# Patient Record
Sex: Female | Born: 1980
Health system: Southern US, Community
[De-identification: ages and names within clinical notes are randomized; demographics above are authoritative.]

## PROBLEM LIST (undated history)

## (undated) DIAGNOSIS — Z87442 Personal history of urinary calculi: Secondary | ICD-10-CM

## (undated) DIAGNOSIS — F32A Depression, unspecified: Secondary | ICD-10-CM

## (undated) DIAGNOSIS — H5310 Unspecified subjective visual disturbances: Secondary | ICD-10-CM

## (undated) DIAGNOSIS — T7840XA Allergy, unspecified, initial encounter: Secondary | ICD-10-CM

## (undated) DIAGNOSIS — F329 Major depressive disorder, single episode, unspecified: Secondary | ICD-10-CM

## (undated) HISTORY — DX: Personal history of urinary calculi: Z87.442

## (undated) HISTORY — DX: Depression, unspecified: F32.A

## (undated) HISTORY — DX: Major depressive disorder, single episode, unspecified: F32.9

## (undated) HISTORY — DX: Unspecified subjective visual disturbances: H53.10

## (undated) HISTORY — DX: Allergy, unspecified, initial encounter: T78.40XA

---

## 2006-07-22 DIAGNOSIS — Z87442 Personal history of urinary calculi: Secondary | ICD-10-CM

## 2006-07-22 HISTORY — DX: Personal history of urinary calculi: Z87.442

## 2007-03-04 ENCOUNTER — Emergency Department (HOSPITAL_COMMUNITY): Admission: EM | Admit: 2007-03-04 | Discharge: 2007-03-05 | Payer: Self-pay | Admitting: Emergency Medicine

## 2008-01-13 IMAGING — CT CT ABDOMEN W/O CM
3 of 4 series · 14 of 32 positions shown, 19 images · IV contrast (agent unspecified)
Comparison: none

CLINICAL DATA: 26-year-old female with left-sided flank pain. Hematuria. Nausea and vomiting. 
 ABDOMEN CT WITHOUT CONTRAST:
TECHNIQUE: Multidetector CT imaging of the abdomen was performed following the standard protocol without IV contrast.
TECHNIQUE: Multidetector CT imaging of the pelvis was performed following the standard protocol without IV contrast.

[Series 2: routine abdomen · axial · 0.70mm/px · z∈[-425,-170]mm · 4 of 87 slices shown, 9 images]
[im 18/87  soft-tissue]
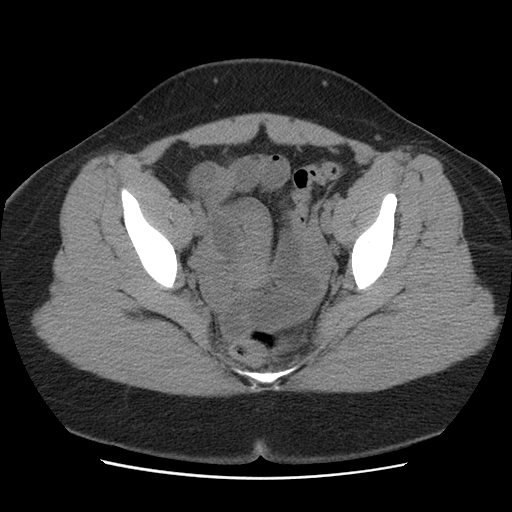
[im 18/87  lung]
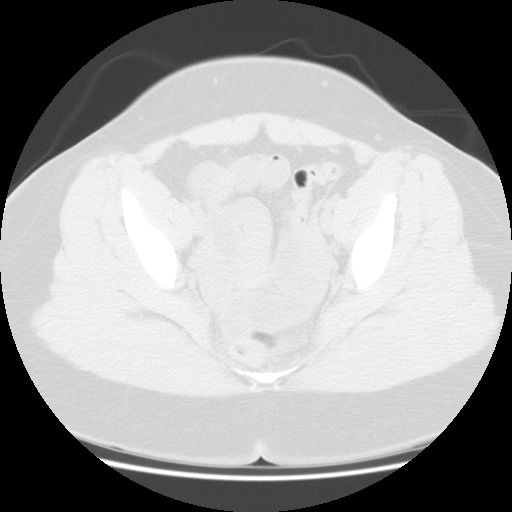
[im 18/87  bone]
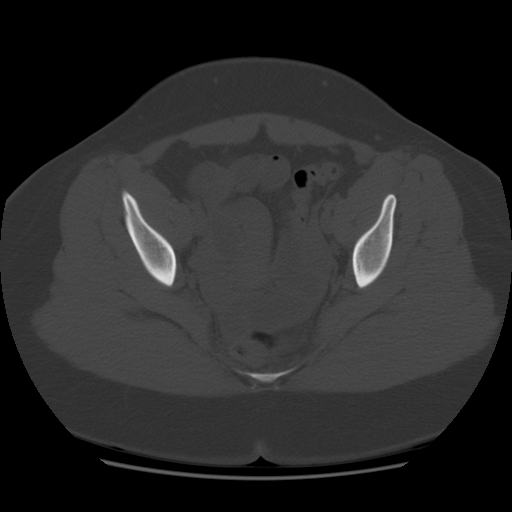
[im 35/87  soft-tissue]
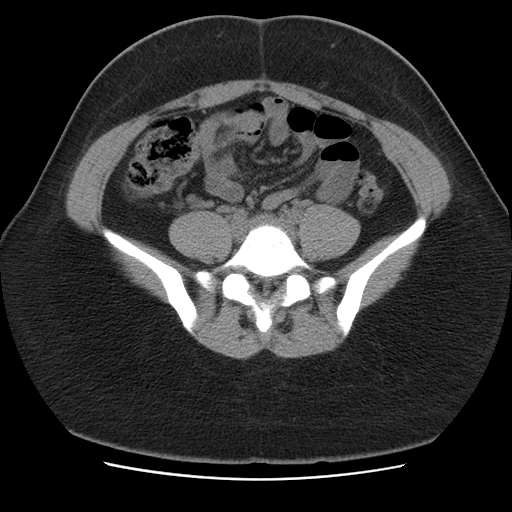
[im 35/87  lung]
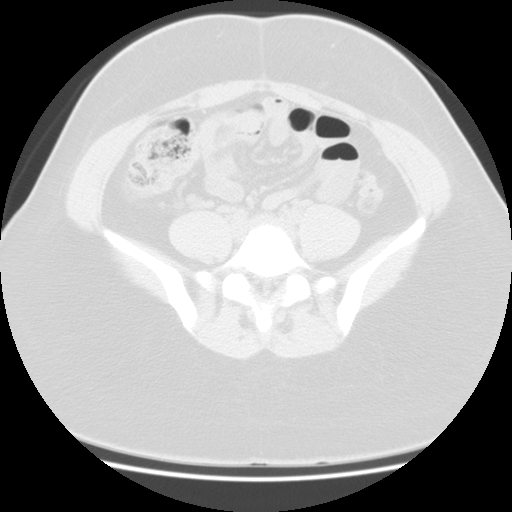
[im 52/87  soft-tissue]
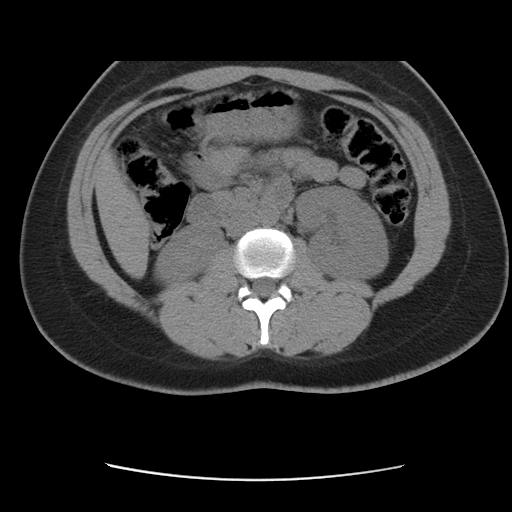
[im 52/87  lung]
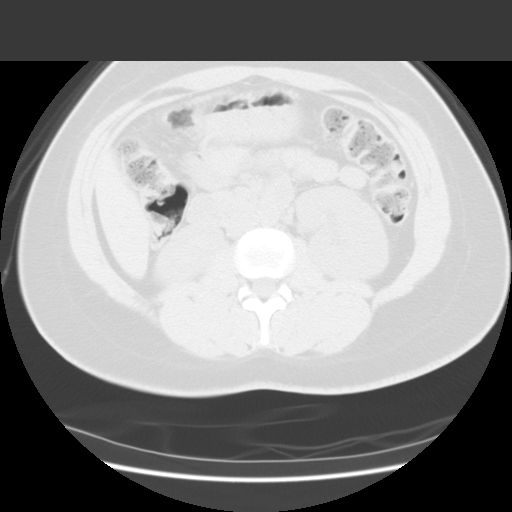
[im 69/87  soft-tissue]
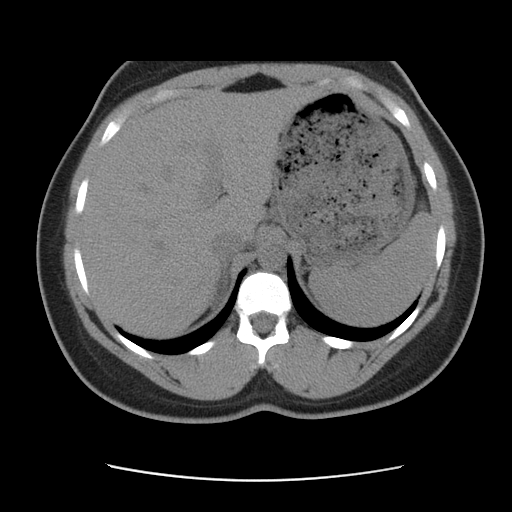
[im 69/87  lung]
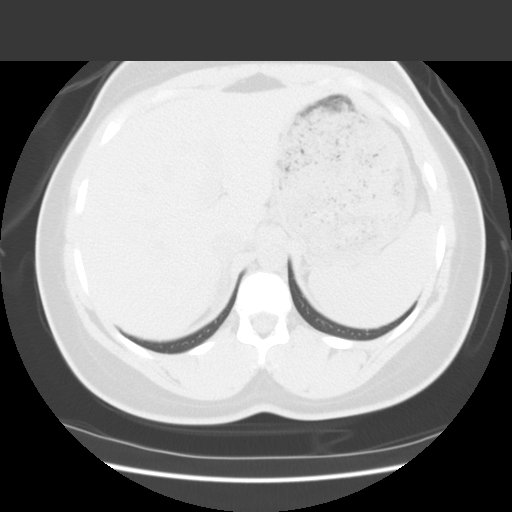

[Series 400: reformatted · sagittal · 0.89mm/px · 8 of 170 slices shown (1 of 2)]
[im 15/170  soft-tissue]
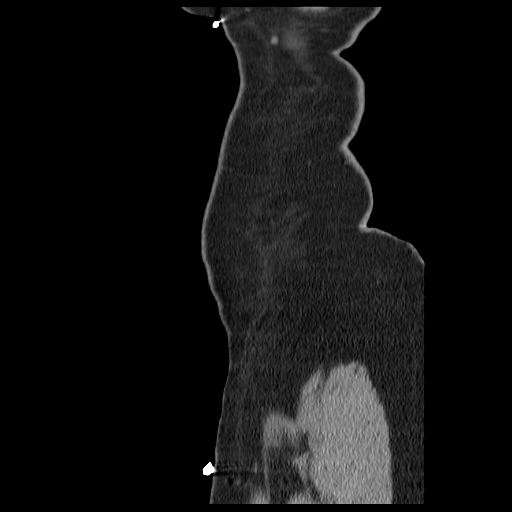
[im 43/170  soft-tissue]
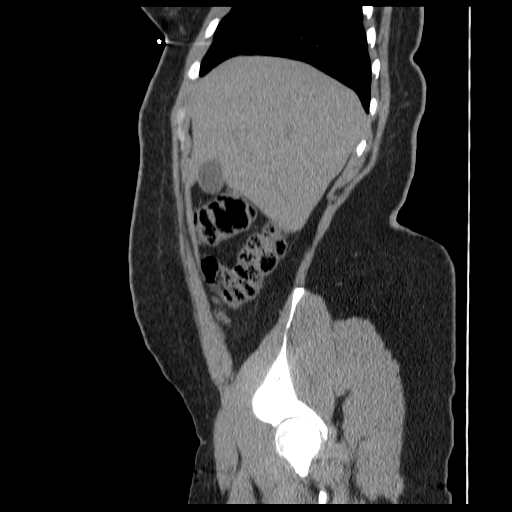
[im 57/170  soft-tissue]
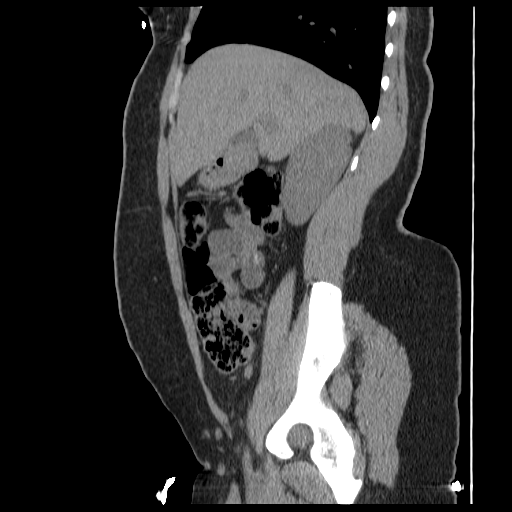
[im 71/170  soft-tissue]
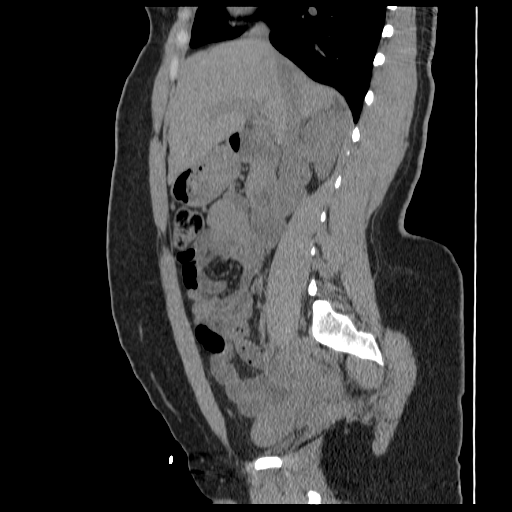
[im 99/170  soft-tissue]
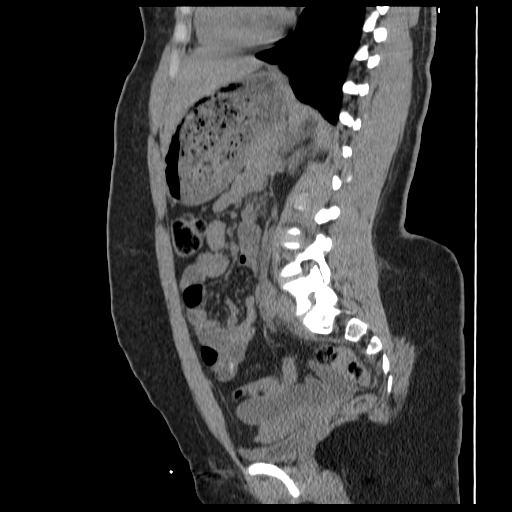
[im 113/170  soft-tissue]
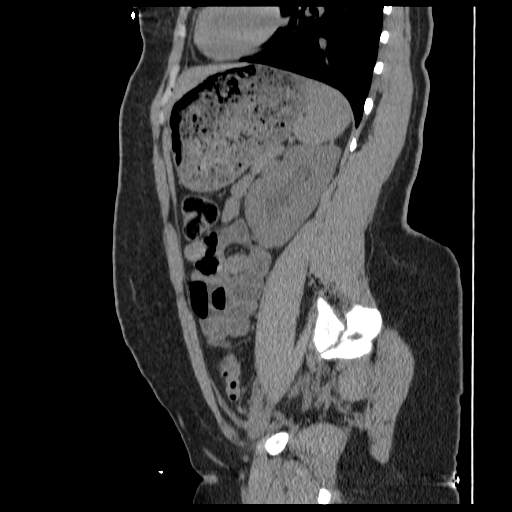
[im 127/170  soft-tissue]
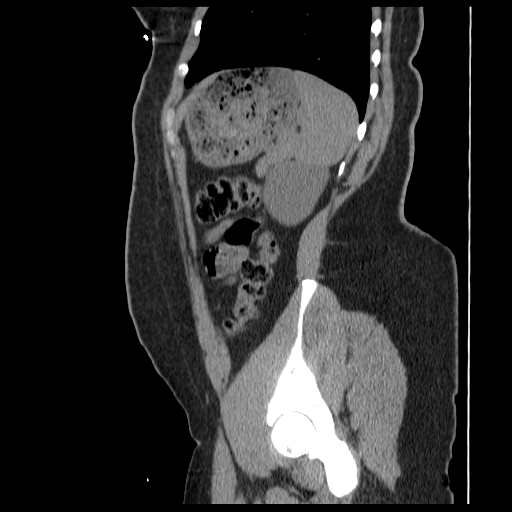
[im 155/170  soft-tissue]
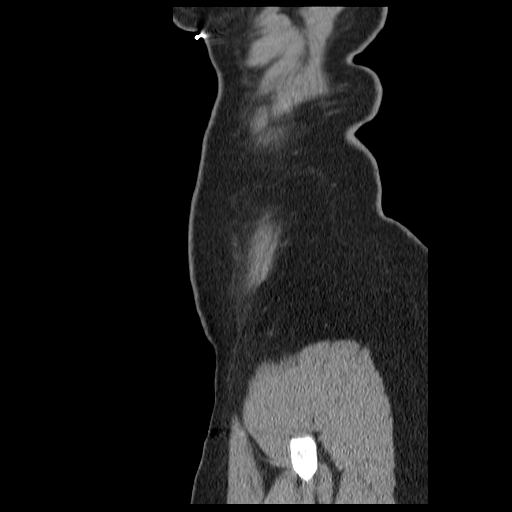

[Series 401: reformatted · coronal · 0.89mm/px · 2 of 148 slices shown (2 of 2)]
[im 15/148  soft-tissue]
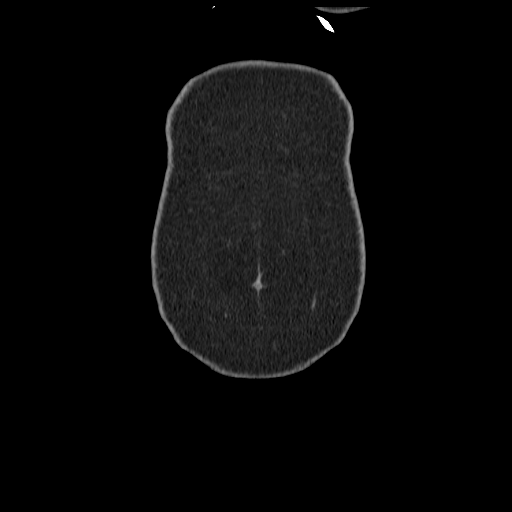
[im 30/148  soft-tissue]
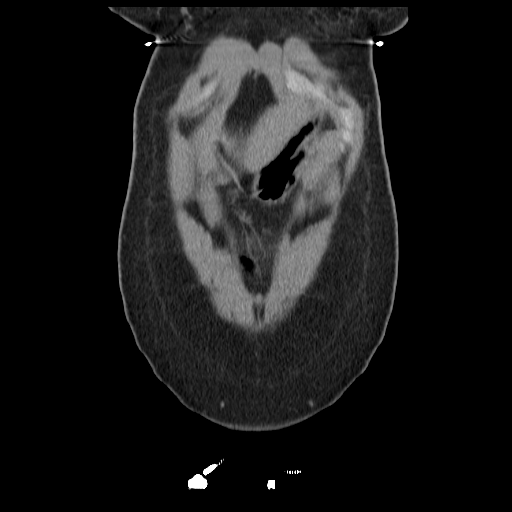

[14 of 32 positions shown; findings below may reference images not displayed]

FINDINGS: The lung bases are clear without focal nodule, mass, or airspace disease. The heart size is normal. There is no significant pleural or pericardial effusion.  The uninfused appearance of the liver and spleen is normal. The pancreas, common bile duct, and gallbladder are within normal limits. The adrenal glands are normal bilaterally. The right kidney is unremarkable. 
 The left kidney demonstrates dilation of the collecting system. The ureter is minimally dilated as well. There is a punctate calcification seen in the potential distal location of the distal ureter. This is somewhat above the ureterovesical junction. It is immediately adjacent to bowel and the ureter is not clearly identified in this area.  No stones are seen within either kidney. There is no significant abdominal lymphadenopathy or free fluid. The bowel is unremarkable.
 Bone windows demonstrate no focal lytic or blastic lesions.
IMPRESSION: Mild hydroureteronephrosis with probable distal ureteral punctate stone. 
 PELVIS CT WITHOUT CONTRAST:
FINDINGS: The rectosigmoid colon is within normal limits. The appendix is visualized and within normal limits. The uterus and adnexa are unremarkable.  Bone windows are negative.
IMPRESSION: Negative CT pelvis.

## 2008-04-18 IMAGING — CR DG FOOT COMPLETE 3+V*L*
1 series · 3 of 3 positions shown · non-contrast
Comparison: NONE

CLINICAL DATA: Injury. 

LEFT FOOT WITH ATTENTION TO THE GREAT TOE

[Series 1: view not recorded · 0.17mm/px · 3 of 3 slices shown]
[im 1/3]
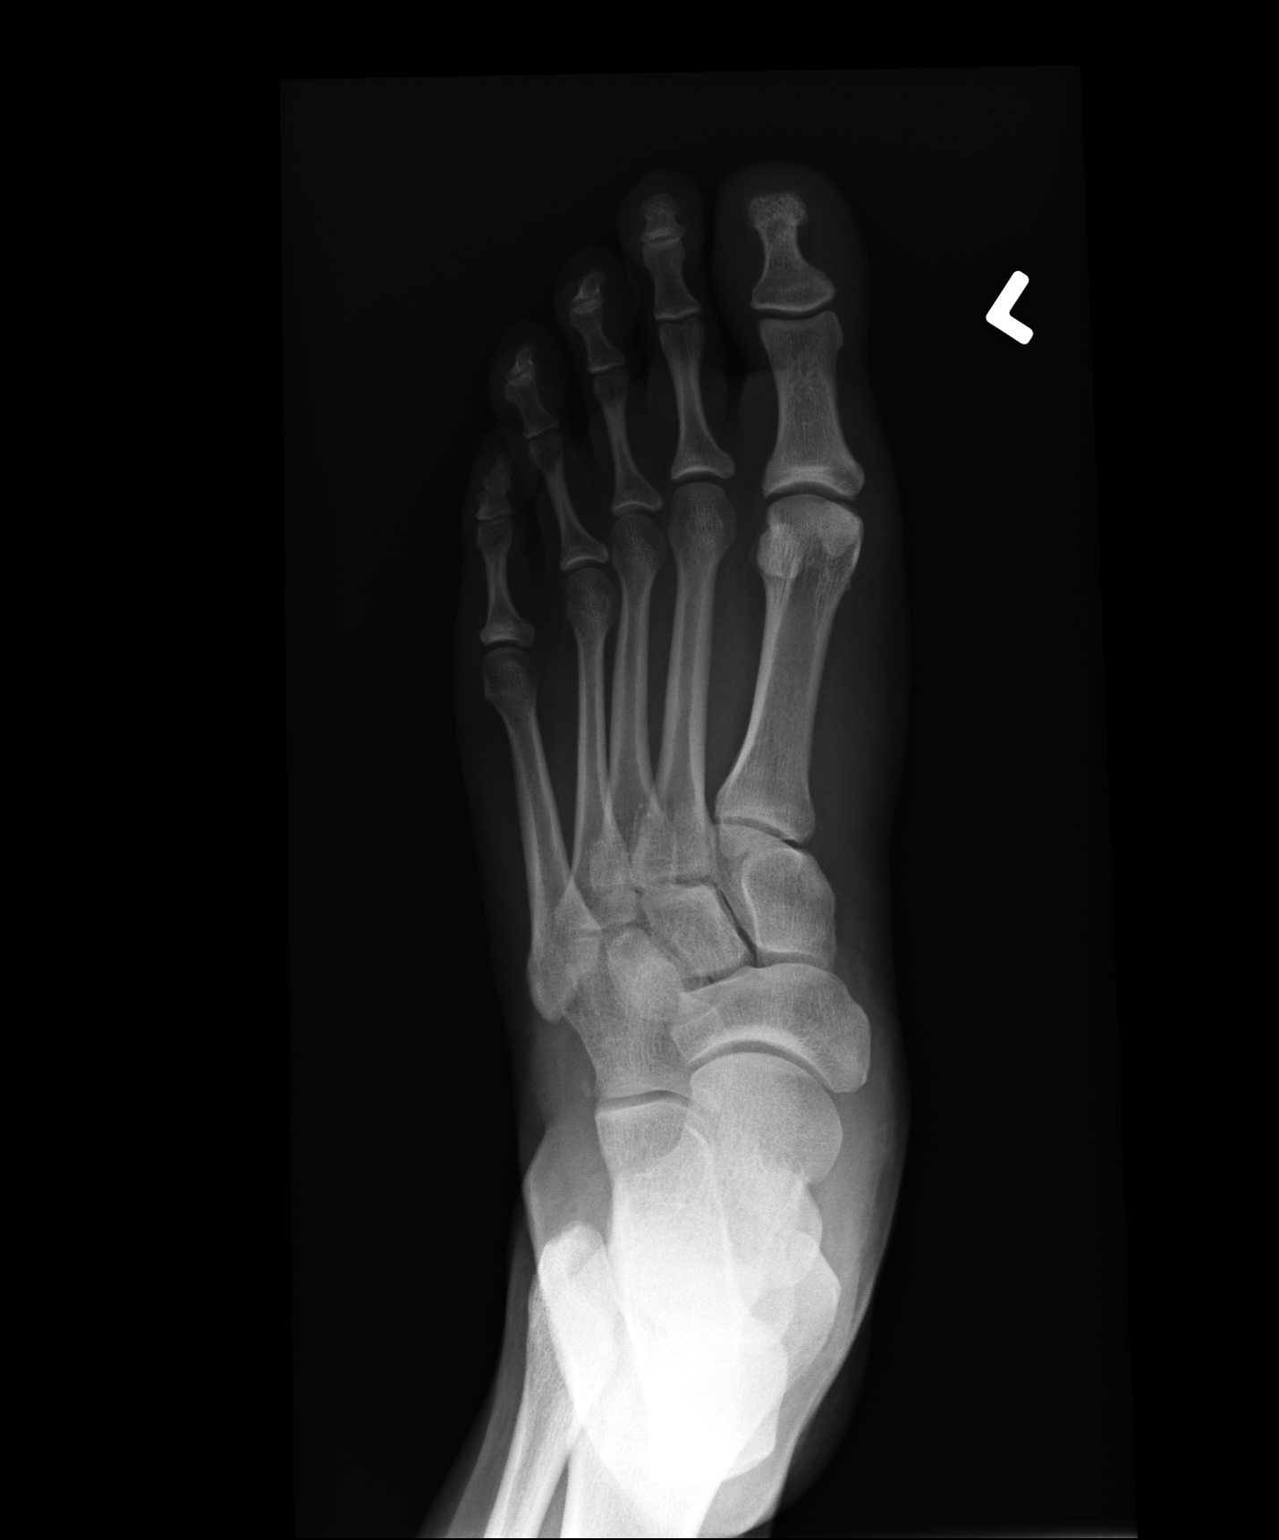
[im 2/3]
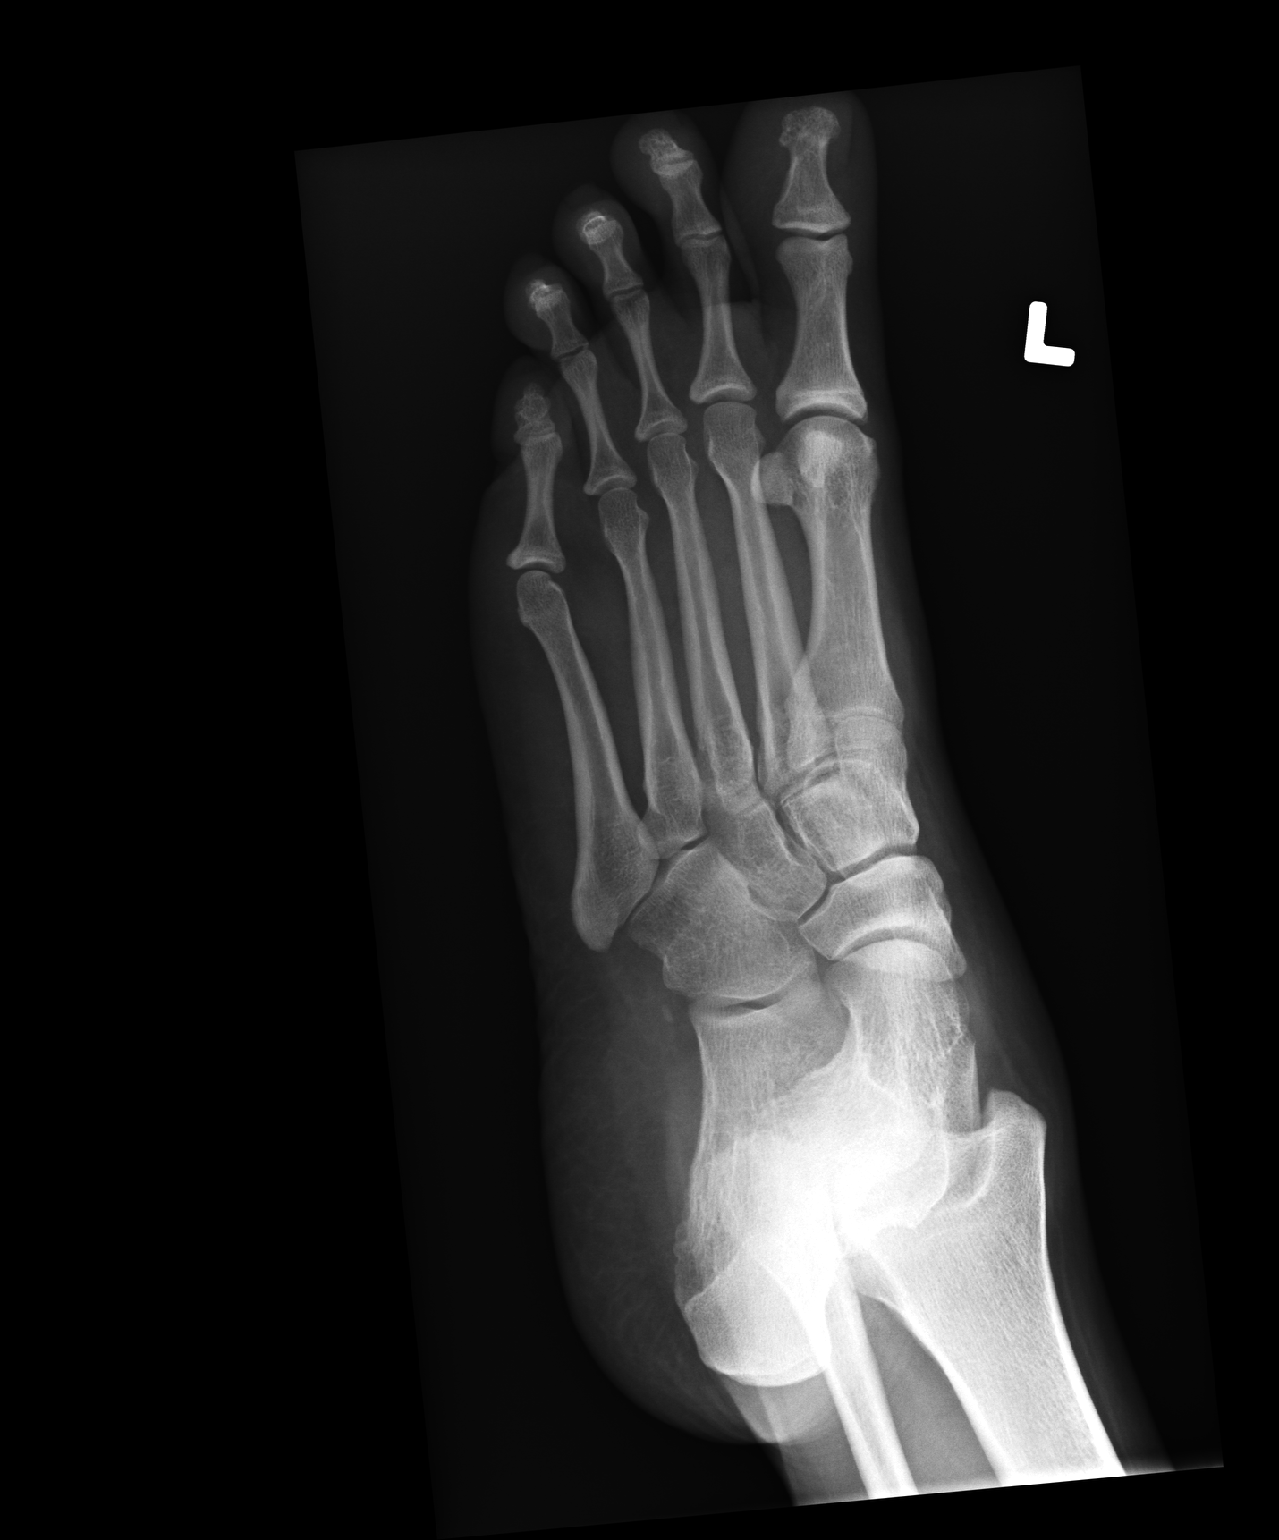
[im 3/3]
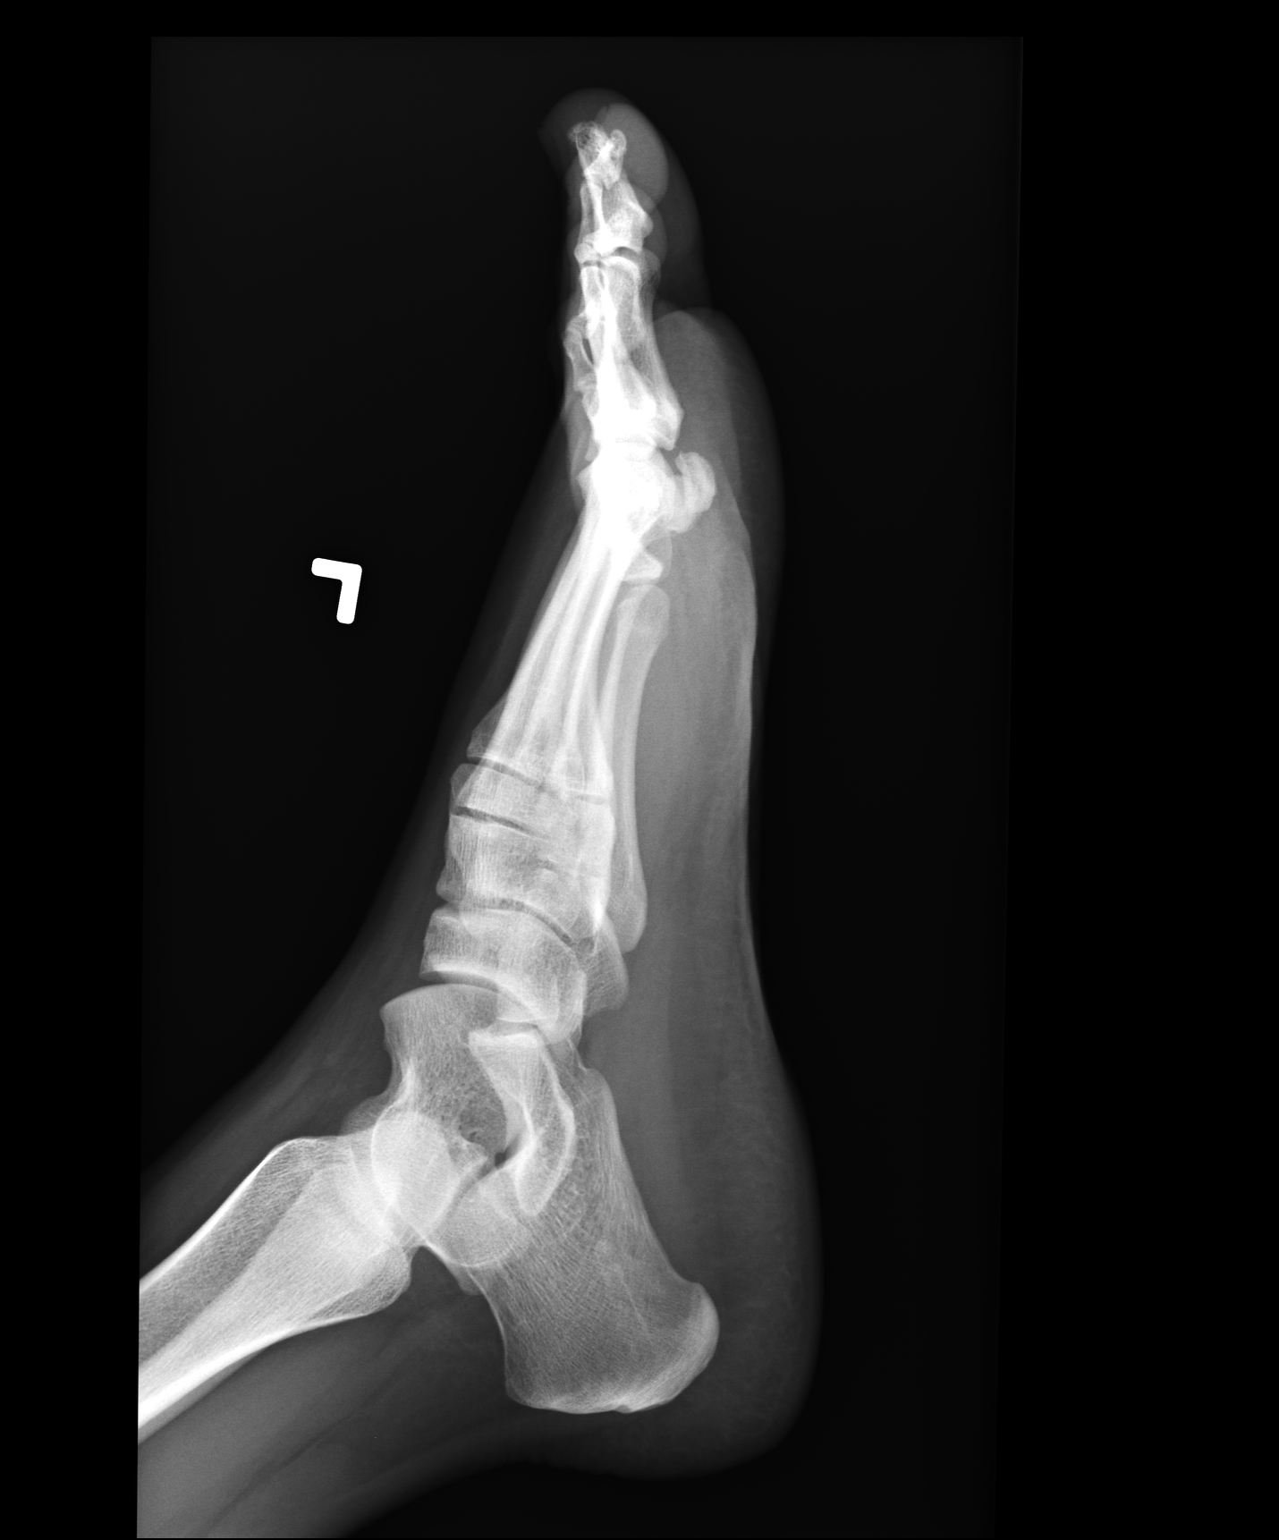

[3 of 3 positions shown; findings below may reference images not displayed]

FINDINGS: Views of the left foot demonstrate no evidence of 
fracture, dislocation, soft tissue abnormality or changes 
suggesting erosive or degenerative arthritis.
IMPRESSION: Normal examination of the left foot. Geovanny Tessier 
06/09/2007  Tran Date:  06/10/2007 DAS  [REDACTED]

## 2008-12-15 ENCOUNTER — Encounter: Admission: RE | Admit: 2008-12-15 | Discharge: 2008-12-15 | Payer: Self-pay | Admitting: Family Medicine

## 2009-07-22 HISTORY — PX: WISDOM TOOTH EXTRACTION: SHX21

## 2009-10-25 IMAGING — CR DG CHEST 2V
2 series · 2 of 2 positions shown · non-contrast
Comparison: None

CLINICAL DATA: Cough

CHEST - 2 VIEW

[w chest pa]
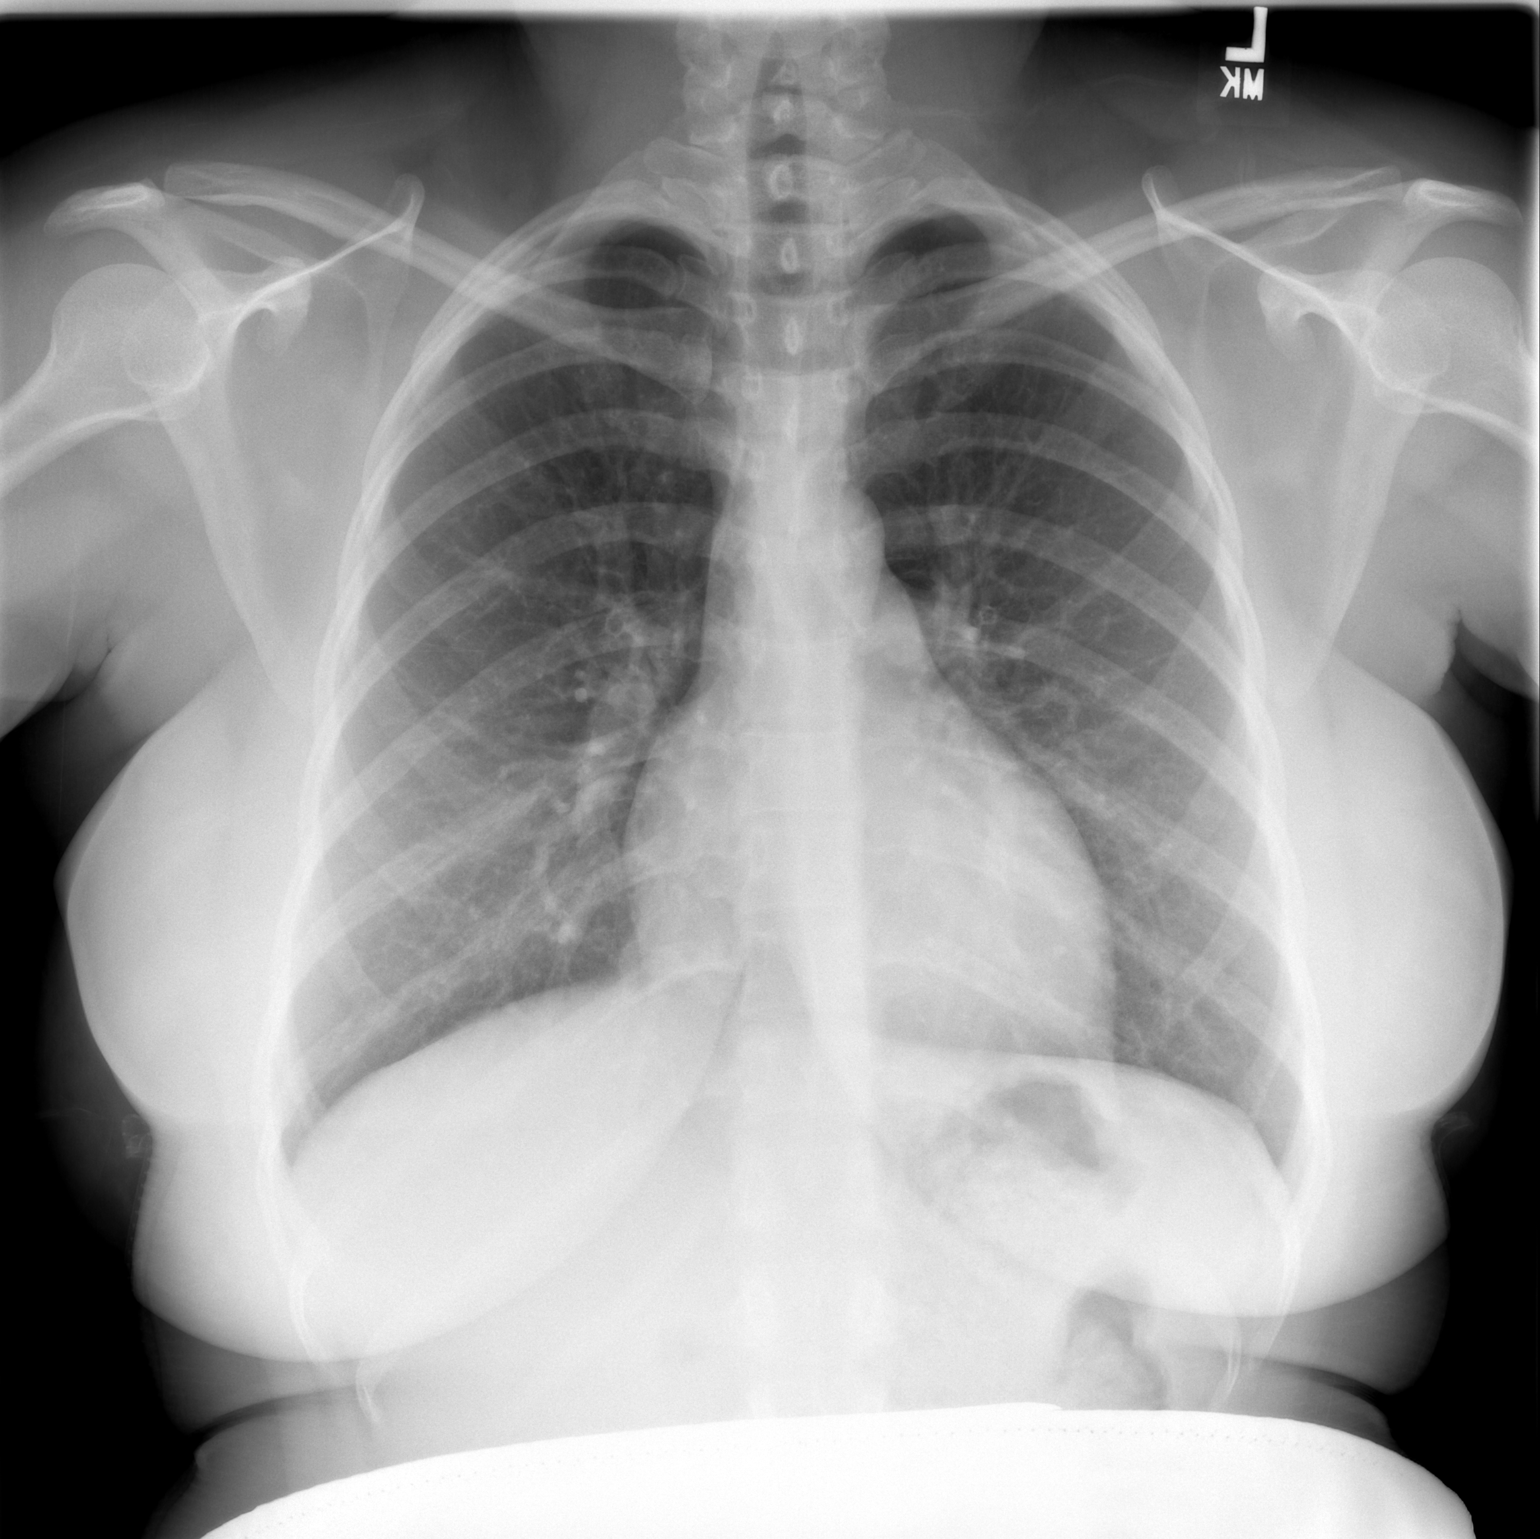

[w chest lat]
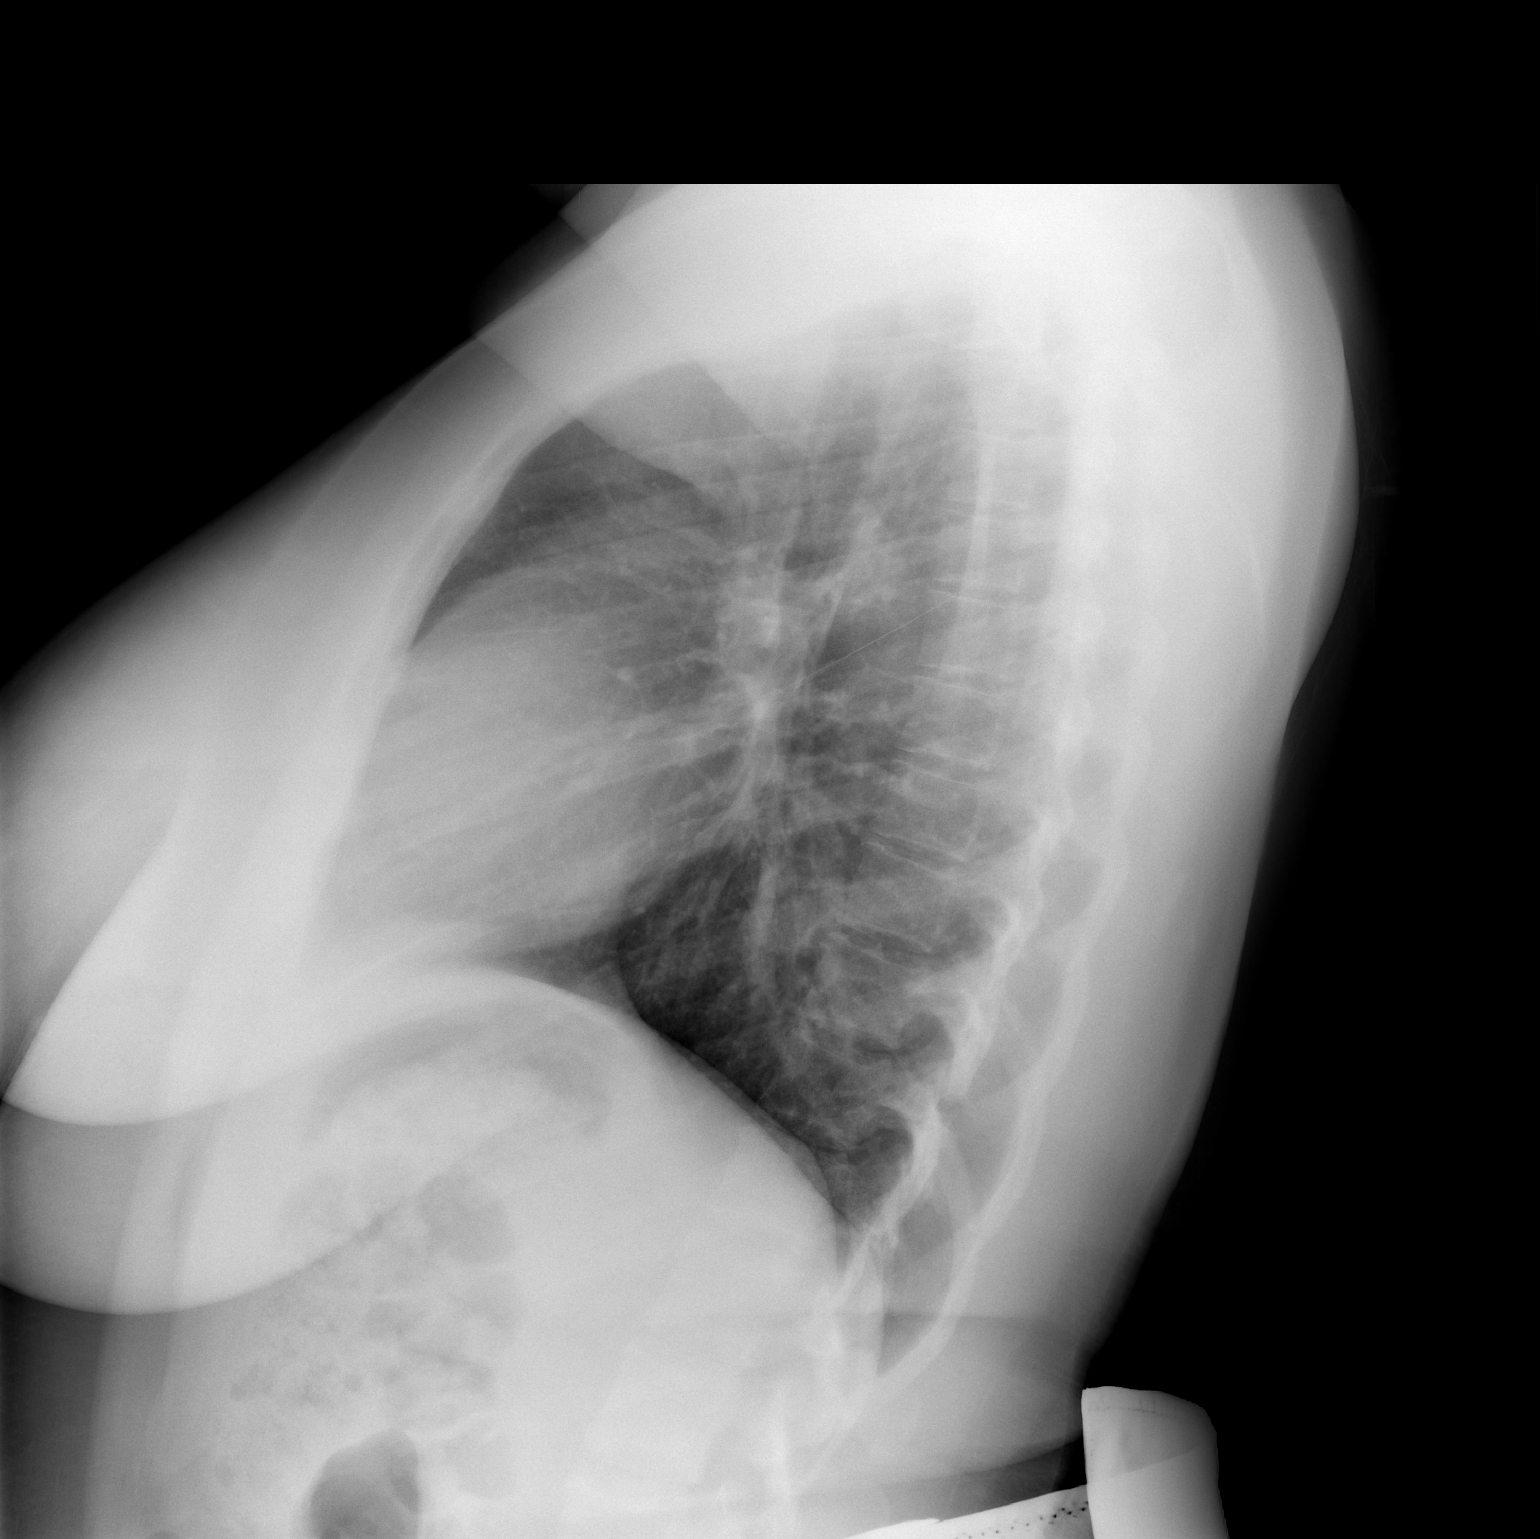

[2 of 2 positions shown; findings below may reference images not displayed]

FINDINGS: Heart is normal in size.  Mild bronchitic changes.  Lungs
are clear.  No pneumothorax or effusion.
IMPRESSION: Mild bronchitic changes.

## 2011-05-06 LAB — I-STAT 8, (EC8 V) (CONVERTED LAB)
Acid-Base Excess: 1
Glucose, Bld: 102 — ABNORMAL HIGH
Hemoglobin: 13.3
Potassium: 3.6
Sodium: 140
TCO2: 27

## 2011-05-06 LAB — URINALYSIS, ROUTINE W REFLEX MICROSCOPIC
Glucose, UA: NEGATIVE
Leukocytes, UA: NEGATIVE
Protein, ur: 30 — AB
Specific Gravity, Urine: 1.01

## 2011-05-06 LAB — URINE MICROSCOPIC-ADD ON

## 2011-05-06 LAB — POCT I-STAT CREATININE
Creatinine, Ser: 1.4 — ABNORMAL HIGH
Operator id: 208821

## 2011-05-30 ENCOUNTER — Emergency Department (INDEPENDENT_AMBULATORY_CARE_PROVIDER_SITE_OTHER)
Admission: EM | Admit: 2011-05-30 | Discharge: 2011-05-30 | Disposition: A | Discharge status: Home / Self Care | Payer: BC Managed Care – PPO | Source: Home / Self Care | Attending: Family Medicine | Admitting: Family Medicine

## 2011-05-30 ENCOUNTER — Encounter: Payer: Self-pay | Admitting: Cardiology

## 2011-05-30 DIAGNOSIS — IMO0001 Reserved for inherently not codable concepts without codable children: Secondary | ICD-10-CM

## 2011-05-30 DIAGNOSIS — S61209A Unspecified open wound of unspecified finger without damage to nail, initial encounter: Secondary | ICD-10-CM

## 2011-05-30 MED ORDER — TETANUS-DIPHTH-ACELL PERTUSSIS 5-2.5-18.5 LF-MCG/0.5 IM SUSP
INTRAMUSCULAR | Status: AC
  Administered 2011-05-30: 0.5 mL via INTRAMUSCULAR
  Filled 2011-05-30: qty 0.5

## 2011-05-30 MED ORDER — TETANUS-DIPHTH-ACELL PERTUSSIS 5-2.5-18.5 LF-MCG/0.5 IM SUSP: 0.5000 mL | Freq: Once | INTRAMUSCULAR | Status: DC

## 2011-05-30 NOTE — ED Notes (Signed)
Pt cut right ring finger on mandolein while cooking about 11am yesterday. Bleeding controlled at this time. Cut is approx 1/2" in length. Edges well approximated.

## 2011-05-30 NOTE — ED Provider Notes (Signed)
History     CSN: [id number] Arrival date & time: 05/30/2011  9:18 AM   First MD Initiated Contact with Patient 05/30/11 1011      Chief Complaint  Patient presents with  . Laceration    (Consider location/radiation/quality/duration/timing/severity/associated sxs/prior treatment) Patient is a 30 y.o. female presenting with skin laceration. The history is provided by the patient.  Laceration  The incident occurred yesterday. The laceration is located on the right hand (Ring FOURTH digit). The laceration is 2 cm in size. The laceration mechanism was a a clean knife. The pain is at a severity of 0/10. Her tetanus status is out of date.    History reviewed. No pertinent past medical history.  Past Surgical History  Procedure Date  . Wisdom tooth extraction 2011    Family History  Problem Relation Age of Onset  . Hypertension Mother   . Diabetes Other   . Hypertension Other     History  Substance Use Topics  . Smoking status: Never Smoker   . Smokeless tobacco: Not on file  . Alcohol Use: Yes     occas glass of wine    OB History    Grav Para Term Preterm Abortions TAB SAB Ect Mult Living                  Review of Systems  Constitutional: Negative.   HENT: Negative.   Eyes: Negative.   Cardiovascular: Negative.   Skin: Positive for wound.  Neurological: Negative.     Allergies  Review of patient's allergies indicates no known allergies.  Home Medications   Current Outpatient Rx  Name Route Sig Dispense Refill  . MONTELUKAST SODIUM 10 MG PO TABS Oral Take 10 mg by mouth at bedtime.        BP 141/89  Pulse 76  Temp(Src) 99.9 F (37.7 C) (Oral)  Resp 16  SpO2 100%  LMP 05/06/2011  Physical Exam  Constitutional: She is oriented to person, place, and time. She appears well-developed and well-nourished.  HENT:  Head: Normocephalic and atraumatic.  Eyes: EOM are normal.  Neck: Normal range of motion.  Musculoskeletal: Normal range of motion.   Hands: Neurological: She is alert and oriented to person, place, and time.  Skin: Skin is warm and dry. Laceration noted.       ED Course  Procedures (including critical care time)  Labs Reviewed - No data to display No results found.   No diagnosis found.    MDM          Richardo Priest, MD 05/30/11 1057

## 2012-11-11 ENCOUNTER — Other Ambulatory Visit (HOSPITAL_COMMUNITY)
Admission: RE | Admit: 2012-11-11 | Discharge: 2012-11-11 | Disposition: A | Discharge status: Home / Self Care | Payer: BC Managed Care – PPO | Source: Ambulatory Visit | Attending: Family | Admitting: Family

## 2012-11-11 ENCOUNTER — Encounter: Payer: Self-pay | Admitting: Family

## 2012-11-11 ENCOUNTER — Ambulatory Visit (INDEPENDENT_AMBULATORY_CARE_PROVIDER_SITE_OTHER): Payer: BC Managed Care – PPO | Admitting: Family

## 2012-11-11 VITALS — BP 122/80 | HR 84 | Ht 67.5 in | Wt 219.0 lb

## 2012-11-11 DIAGNOSIS — Z0000 Encounter for general adult medical examination without abnormal findings: Secondary | ICD-10-CM

## 2012-11-11 DIAGNOSIS — J309 Allergic rhinitis, unspecified: Secondary | ICD-10-CM

## 2012-11-11 DIAGNOSIS — Z124 Encounter for screening for malignant neoplasm of cervix: Secondary | ICD-10-CM

## 2012-11-11 DIAGNOSIS — Z01419 Encounter for gynecological examination (general) (routine) without abnormal findings: Secondary | ICD-10-CM | POA: Insufficient documentation

## 2012-11-11 LAB — LIPID PANEL
LDL Cholesterol: 125 mg/dL — ABNORMAL HIGH (ref 0–99)
Total CHOL/HDL Ratio: 3
Triglycerides: 51 mg/dL (ref 0.0–149.0)

## 2012-11-11 LAB — COMPREHENSIVE METABOLIC PANEL
ALT: 12 U/L (ref 0–35)
AST: 16 U/L (ref 0–37)
Creatinine, Ser: 0.7 mg/dL (ref 0.4–1.2)
Total Bilirubin: 0.5 mg/dL (ref 0.3–1.2)

## 2012-11-11 LAB — CBC WITH DIFFERENTIAL/PLATELET
Basophils Absolute: 0 10*3/uL (ref 0.0–0.1)
Eosinophils Absolute: 0 10*3/uL (ref 0.0–0.7)
HCT: 32.9 % — ABNORMAL LOW (ref 36.0–46.0)
Hemoglobin: 10.8 g/dL — ABNORMAL LOW (ref 12.0–15.0)
Lymphs Abs: 1.4 10*3/uL (ref 0.7–4.0)
MCHC: 32.7 g/dL (ref 30.0–36.0)
Monocytes Relative: 5.3 % (ref 3.0–12.0)
Neutro Abs: 4.3 10*3/uL (ref 1.4–7.7)
RDW: 17 % — ABNORMAL HIGH (ref 11.5–14.6)

## 2012-11-11 LAB — POCT URINALYSIS DIPSTICK
Blood, UA: NEGATIVE
Glucose, UA: NEGATIVE
Nitrite, UA: NEGATIVE
Urobilinogen, UA: 0.2

## 2012-11-11 NOTE — Patient Instructions (Signed)
Breast Self-Exam A self breast exam may help you find changes or problems while they are still small. Do a breast self-exam:  Every month.  One week after your period (menstrual period).  On the first day of each month if you do not have periods anymore. Look for any:  Change in breast color, size, or shape.  Dimples in your breast.  Changes in your nipples or skin.  Dry skin on your breasts or nipples.  Watery or bloody discharge from your nipples.  Feel for:  Lumps.  Thick, hard places.  Any other changes. HOME CARE There are 3 ways to do the breast self-exam: In front of a mirror.  Lift your arms over your head and turn side to side.  Put your hands on your hips and lean down, then turn from side to side.  Bend forward and turn from side to side. In the shower.  With soapy hands, check both breasts. Then check above and below your collarbone and your armpits.  Feel above and below your collarbone down to under your breast, and from the center of your chest to the outer edge of the armpit. Check for any lumps or hard spots.  Using the tips of your middle three fingers check your whole breast by pressing your hand over your breast in a circle or in an up and down motion. Lying down.  Lie flat on your bed.  Put a small pillow under the breast you are going to check. On that same side, put your hand behind your head.  With your other hand, use the 3 middle fingers to feel the breast.  Move your fingers in a circle around the breast. Press firmly over all parts of the breast to feel for any lumps. GET HELP RIGHT AWAY IF: You find any changes in your breasts so they can be checked. Document Released: 12/25/2007 Document Revised: 09/30/2011 Document Reviewed: 10/26/2008 [facility] Patient Information 2013 [place], [place].  Exercise to Stay Healthy Exercise helps you become and stay healthy. EXERCISE IDEAS AND TIPS Choose exercises that:  You enjoy.  Fit into  your day. You do not need to exercise really hard to be healthy. You can do exercises at a slow or medium level and stay healthy. You can:  Stretch before and after working out.  Try yoga, Pilates, or tai chi.  Lift weights.  Walk fast, swim, jog, run, climb stairs, bicycle, dance, or rollerskate.  Take aerobic classes. Exercises that burn about 150 calories:  Running 1  miles in 15 minutes.  Playing volleyball for 45 to 60 minutes.  Washing and waxing a car for 45 to 60 minutes.  Playing touch football for 45 minutes.  Walking 1  miles in 35 minutes.  Pushing a stroller 1  miles in 30 minutes.  Playing basketball for 30 minutes.  Raking leaves for 30 minutes.  Bicycling 5 miles in 30 minutes.  Walking 2 miles in 30 minutes.  Dancing for 30 minutes.  Shoveling snow for 15 minutes.  Swimming laps for 20 minutes.  Walking up stairs for 15 minutes.  Bicycling 4 miles in 15 minutes.  Gardening for 30 to 45 minutes.  Jumping rope for 15 minutes.  Washing windows or floors for 45 to 60 minutes. Document Released: 08/10/2010 Document Revised: 09/30/2011 Document Reviewed: 08/10/2010 [facility] Patient Information 2013 [place], [place].

## 2012-11-11 NOTE — Progress Notes (Signed)
Subjective:    Patient ID: Shelly [name], female    DOB: March 03, 1981, 32 y.o.   MRN: [id number]  HPI 32 year old African American female, nonsmoker, new patient to the practice is in to be established and for complete physical exam. She has a history of allergic rhinitis and takes Singulair over-the-counter that works effectively. She denies any concerns any sexually transmitted diseases. Does not routinely perform self breast exams. Last gynecological exam was 2 years ago. She's not currently sexually active.   Review of Systems  Constitutional: Negative.   HENT: Negative.   Eyes: Negative.   Respiratory: Negative.   Cardiovascular: Negative.   Gastrointestinal: Negative.   Endocrine: Negative.   Genitourinary: Negative.   Musculoskeletal: Negative.   Skin: Negative.   Allergic/Immunologic: Negative.   Neurological: Negative.   Hematological: Negative.   Psychiatric/Behavioral: Negative.    History reviewed. No pertinent past medical history.  History   Social History  . Marital Status: Single    Spouse Name: N/A    Number of Children: N/A  . Years of Education: N/A   Occupational History  . Not on file.   Social History Main Topics  . Smoking status: Never Smoker   . Smokeless tobacco: Not on file  . Alcohol Use: Yes     Comment: occas glass of wine  . Drug Use: No  . Sexually Active:    Other Topics Concern  . Not on file   Social History Narrative  . No narrative on file    Past Surgical History  Procedure Laterality Date  . Wisdom tooth extraction  2011    Family History  Problem Relation Age of Onset  . Hypertension Mother   . Diabetes Other   . Hypertension Other     No Known Allergies  Current Outpatient Prescriptions on File Prior to Visit  Medication Sig Dispense Refill  . montelukast (SINGULAIR) 10 MG tablet Take 10 mg by mouth at bedtime.         No current facility-administered medications on file prior to visit.    BP 122/80   Pulse 84  Ht 5' 7.5" (1.715 m)  Wt 219 lb (99.338 kg)  BMI 33.77 kg/m2  SpO2 99%  LMP 04/05/2014chart    Objective:   Physical Exam  Constitutional: She is oriented to person, place, and time. She appears well-developed and well-nourished.  HENT:  Head: Normocephalic.  Right Ear: External ear normal.  Left Ear: External ear normal.  Nose: Nose normal.  Mouth/Throat: Oropharynx is clear and moist.  Eyes: Conjunctivae are normal. Pupils are equal, round, and reactive to light.  Neck: Normal range of motion. Neck supple. No thyromegaly present.  Cardiovascular: Normal rate, regular rhythm and normal heart sounds.   Pulmonary/Chest: Effort normal and breath sounds normal.  Abdominal: Soft. Bowel sounds are normal.  Genitourinary: Vagina normal and uterus normal. No vaginal discharge found.  Musculoskeletal: Normal range of motion.  Neurological: She is alert and oriented to person, place, and time. She has normal reflexes. No cranial nerve deficit.  Skin: Skin is warm and dry.  Psychiatric: She has a normal mood and affect.          Assessment & Plan:  Assessment:  1. Preventative healthcare 2. Allergic rhinitis  Plan: Lab sent to include CMP, lipids, CBC, TSH notify patient pending results. Continue current medications. Pap smear sent. Encouraged healthy diet, exercise, monthly self breast exams, safe sex practices. Patient call the office with any questions or concerns. Recheck a schedule,  and as needed.

## 2013-01-06 ENCOUNTER — Ambulatory Visit (INDEPENDENT_AMBULATORY_CARE_PROVIDER_SITE_OTHER): Payer: BC Managed Care – PPO | Admitting: Family

## 2013-01-06 ENCOUNTER — Encounter: Payer: Self-pay | Admitting: Family

## 2013-01-06 VITALS — BP 120/76 | HR 71 | Wt 221.0 lb

## 2013-01-06 DIAGNOSIS — F329 Major depressive disorder, single episode, unspecified: Secondary | ICD-10-CM

## 2013-01-06 DIAGNOSIS — F411 Generalized anxiety disorder: Secondary | ICD-10-CM

## 2013-01-06 MED ORDER — SERTRALINE HCL 50 MG PO TABS: 50.0000 mg | ORAL_TABLET | Freq: Every day | ORAL | Status: DC

## 2013-01-06 MED ORDER — DIAZEPAM 5 MG PO TABS: 5.0000 mg | ORAL_TABLET | Freq: Two times a day (BID) | ORAL | Status: DC | PRN

## 2013-01-06 NOTE — Patient Instructions (Addendum)

## 2013-01-06 NOTE — Progress Notes (Signed)
  Subjective:    Patient ID: Shelly [name], female    DOB: [birth year], 32 y.o.   MRN: [id number]  HPI  Pt is a 32 year old African American female who presents to the PCP with feelings of depression and anxiety x 2 months; Pt relates these feelings to a change in work conditions. States before going into work she can not sleep the night before, while she is at work she feels very anxious and upon returning home she feels depressed only wanting to get in the bed and not interact with others or participate in normal activities. Denies any homicidal or suicidal ideations.  Review of Systems  Constitutional: Negative.   HENT: Negative.   Eyes: Negative.   Respiratory: Negative.   Cardiovascular: Negative.   Gastrointestinal: Negative.   Endocrine: Negative.   Genitourinary: Negative.   Musculoskeletal: Negative.   Allergic/Immunologic: Negative.   Neurological: Negative.   Hematological: Negative.   Psychiatric/Behavioral: Positive for sleep disturbance and dysphoric mood. The patient is nervous/anxious.        No past medical history on file.  History   Social History  . Marital Status: Single    Spouse Name: N/A    Number of Children: N/A  . Years of Education: N/A   Occupational History  . Not on file.   Social History Main Topics  . Smoking status: Never Smoker   . Smokeless tobacco: Not on file  . Alcohol Use: Yes     Comment: occas glass of wine  . Drug Use: No  . Sexually Active:    Other Topics Concern  . Not on file   Social History Narrative  . No narrative on file    Past Surgical History  Procedure Laterality Date  . Wisdom tooth extraction  2011    Family History  Problem Relation Age of Onset  . Hypertension Mother   . Diabetes Other   . Hypertension Other     No Known Allergies  Current Outpatient Prescriptions on File Prior to Visit  Medication Sig Dispense Refill  . montelukast (SINGULAIR) 10 MG tablet Take 10 mg by mouth at bedtime.         Marland Kitchen olopatadine (PATANOL) 0.1 % ophthalmic solution Place 1 drop into both eyes 2 (two) times daily.       No current facility-administered medications on file prior to visit.    BP 120/76  Pulse 71  Wt 221 lb (100.245 kg)  BMI 34.08 kg/m2  SpO2 98%chart Objective:   Physical Exam  Constitutional: She is oriented to person, place, and time. She appears well-developed and well-nourished.  HENT:  Head: Normocephalic and atraumatic.  Eyes: Pupils are equal, round, and reactive to light.  Neck: Normal range of motion.  Cardiovascular: Normal rate, regular rhythm and normal heart sounds.   Pulmonary/Chest: Effort normal and breath sounds normal.  Abdominal: Soft. Bowel sounds are normal.  Musculoskeletal: Normal range of motion.  Neurological: She is alert and oriented to person, place, and time.  Skin: Skin is warm and dry.  Psychiatric:  Tearful          Assessment & Plan:  1. Depression 2. Anxiety  Pt placed on Zoloft and Valium for management of symptoms for anxiety and depression. Pt encouraged to participate in cognitive therapy. Pt will follow up with PCP in 3 weeks or sooner if symptoms worsen or patient starts to experience suicidal or homicidal ideations.

## 2013-01-27 ENCOUNTER — Encounter: Payer: Self-pay | Admitting: Family

## 2013-01-27 ENCOUNTER — Ambulatory Visit (INDEPENDENT_AMBULATORY_CARE_PROVIDER_SITE_OTHER): Payer: BC Managed Care – PPO | Admitting: Family

## 2013-01-27 VITALS — BP 120/80 | HR 100 | Wt 215.0 lb

## 2013-01-27 DIAGNOSIS — F430 Acute stress reaction: Secondary | ICD-10-CM

## 2013-01-27 DIAGNOSIS — F411 Generalized anxiety disorder: Secondary | ICD-10-CM

## 2013-01-27 NOTE — Progress Notes (Signed)
  Subjective:    Patient ID: Shelly [name], female    DOB: 1980-08-27, 32 y.o.   MRN: [id number]  HPI 32 year old [place] American female, nonsmoker is in for recheck of acute stress reaction and depression. She is having difficulty coping at work with the demands of the job. Since her last office visit she's been on Zoloft 50 mg once daily and tolerating it well. She periodically takes Valium as needed. She believes that she's significantly better than she was at her last office visit. She denies any feelings of helplessness, hopelessness, thoughts of death or dying.  As of note, her boss was fired for drug use.  Review of Systems  Constitutional: Negative.   HENT: Negative.   Respiratory: Negative.   Cardiovascular: Negative.   Gastrointestinal: Negative.   Endocrine: Negative.   Musculoskeletal: Negative.   Neurological: Negative.   Hematological: Negative.   Psychiatric/Behavioral: Negative.    History reviewed. No pertinent past medical history.  History   Social History  . Marital Status: Single    Spouse Name: N/A    Number of Children: N/A  . Years of Education: N/A   Occupational History  . Not on file.   Social History Main Topics  . Smoking status: Never Smoker   . Smokeless tobacco: Not on file  . Alcohol Use: Yes     Comment: occas glass of wine  . Drug Use: No  . Sexually Active:    Other Topics Concern  . Not on file   Social History Narrative  . No narrative on file    Past Surgical History  Procedure Laterality Date  . Wisdom tooth extraction  2011    Family History  Problem Relation Age of Onset  . Hypertension Mother   . Diabetes Other   . Hypertension Other     No Known Allergies  Current Outpatient Prescriptions on File Prior to Visit  Medication Sig Dispense Refill  . diazepam (VALIUM) 5 MG tablet Take 1 tablet (5 mg total) by mouth every 12 (twelve) hours as needed for anxiety.  30 tablet  1  . montelukast (SINGULAIR) 10 MG tablet  Take 10 mg by mouth at bedtime.        Marland Kitchen olopatadine (PATANOL) 0.1 % ophthalmic solution Place 1 drop into both eyes 2 (two) times daily.      . sertraline (ZOLOFT) 50 MG tablet Take 1 tablet (50 mg total) by mouth daily.  30 tablet  3   No current facility-administered medications on file prior to visit.    BP 120/80  Pulse 100  Wt 215 lb (97.523 kg)  BMI 33.16 kg/m2  SpO2 98%chart    Objective:   Physical Exam  Constitutional: She is oriented to person, place, and time. She appears well-developed and well-nourished.  Neck: No thyromegaly present.  Cardiovascular: Normal rate, regular rhythm and normal heart sounds.   Pulmonary/Chest: Effort normal and breath sounds normal.  Neurological: She is alert and oriented to person, place, and time.  Skin: Skin is warm and dry.  Psychiatric: She has a normal mood and affect.          Assessment & Plan:  Assessment: 1. Acute stress reaction  2. Depression  Plan: Continue current medications. Recheck in 3 months. Continue exercising. Call the office with any questions or concerns.

## 2013-04-28 ENCOUNTER — Ambulatory Visit: Payer: BC Managed Care – PPO | Admitting: Family

## 2013-05-27 ENCOUNTER — Other Ambulatory Visit: Payer: Self-pay

## 2013-11-29 ENCOUNTER — Ambulatory Visit (INDEPENDENT_AMBULATORY_CARE_PROVIDER_SITE_OTHER): Payer: BC Managed Care – PPO | Admitting: Family

## 2013-11-29 ENCOUNTER — Encounter: Payer: Self-pay | Admitting: Family

## 2013-11-29 VITALS — BP 132/80 | HR 120 | Temp 98.6°F | Wt 239.0 lb

## 2013-11-29 DIAGNOSIS — J301 Allergic rhinitis due to pollen: Secondary | ICD-10-CM

## 2013-11-29 DIAGNOSIS — J309 Allergic rhinitis, unspecified: Secondary | ICD-10-CM

## 2013-11-29 MED ORDER — FLUTICASONE PROPIONATE 50 MCG/ACT NA SUSP: 2.0000 | Freq: Every day | NASAL | Status: DC

## 2013-11-29 MED ORDER — LORATADINE-PSEUDOEPHEDRINE ER 10-240 MG PO TB24: 1.0000 | ORAL_TABLET | Freq: Every day | ORAL | Status: DC

## 2013-11-29 MED ORDER — METHYLPREDNISOLONE 4 MG PO KIT: PACK | ORAL | Status: AC

## 2013-11-29 NOTE — Progress Notes (Signed)
   Subjective:    Patient ID: Shelly [name], female    DOB: 09/06/1980, 33 y.o.   MRN: [id number]  HPI 33 year old [place] female, nonsmoker is in today with complaints of cough, congestion, sneezing worsening over the last several days but has been ongoing x3 months. Has a history of allergic rhinitis and has been taking Singulair without relief. Has a dog in the home. Denies fever   Review of Systems  Constitutional: Negative.  Negative for fever.  HENT: Positive for congestion, postnasal drip, rhinorrhea and sneezing.   Respiratory: Positive for cough. Negative for shortness of breath and wheezing.   Cardiovascular: Negative.   Musculoskeletal: Negative.   Skin: Negative.   Allergic/Immunologic: Positive for environmental allergies.       A dog in the home  Hematological: Negative.   Psychiatric/Behavioral: Negative.    History reviewed. No pertinent past medical history.  History   Social History  . Marital Status: Single    Spouse Name: N/A    Number of Children: N/A  . Years of Education: N/A   Occupational History  . Not on file.   Social History Main Topics  . Smoking status: Never Smoker   . Smokeless tobacco: Not on file  . Alcohol Use: Yes     Comment: occas glass of wine  . Drug Use: No  . Sexual Activity:    Other Topics Concern  . Not on file   Social History Narrative  . No narrative on file    Past Surgical History  Procedure Laterality Date  . Wisdom tooth extraction  2011    Family History  Problem Relation Age of Onset  . Hypertension Mother   . Diabetes Other   . Hypertension Other     No Known Allergies  Current Outpatient Prescriptions on File Prior to Visit  Medication Sig Dispense Refill  . olopatadine (PATANOL) 0.1 % ophthalmic solution Place 1 drop into both eyes 2 (two) times daily.       No current facility-administered medications on file prior to visit.    BP 132/80  Pulse 120  Temp(Src) 98.6 F (37 C)  (Oral)  Wt 239 lb (108.41 kg)  SpO2 96%chart    Objective:   Physical Exam  Constitutional: She is oriented to person, place, and time. She appears well-developed and well-nourished.  HENT:  Right Ear: External ear normal.  Left Ear: External ear normal.  Mouth/Throat: Oropharynx is clear and moist.  Nasal turbinates red and swollen  Neck: Normal range of motion. Neck supple.  Cardiovascular: Normal rate, regular rhythm and normal heart sounds.   Pulmonary/Chest: Effort normal and breath sounds normal.  Musculoskeletal: Normal range of motion.  Neurological: She is alert and oriented to person, place, and time.  Skin: Skin is warm and dry.          Assessment & Plan:  Shelly [name] was seen today for sinusitis and uri.  Diagnoses and associated orders for this visit:  Hay fever  Other Orders - methylPREDNISolone (MEDROL DOSEPAK) 4 MG tablet; follow package directions - fluticasone (FLONASE) 50 MCG/ACT nasal spray; Place 2 sprays into both nostrils daily. - loratadine-pseudoephedrine (CLARITIN-D 24 HOUR) 10-240 MG per 24 hr tablet; Take 1 tablet by mouth daily.   Call the office with any questions or concerns. Check as scheduled and as needed.

## 2013-11-29 NOTE — Progress Notes (Signed)
Pre visit review using our clinic review tool, if applicable. No additional management support is needed unless otherwise documented below in the visit note. 

## 2013-11-29 NOTE — Patient Instructions (Signed)
Hay Fever Hay fever is an allergic reaction to particles in the air. It cannot be passed from person to person. It cannot be cured, but it can be controlled. CAUSES  Hay fever is caused by something that triggers an allergic reaction (allergens). The following are examples of allergens:  Ragweed.  Feathers.  Animal dander.  Grass and tree pollens.  Cigarette smoke.  House dust.  Pollution. SYMPTOMS   Sneezing.  Runny or stuffy nose.  Tearing eyes.  Itchy eyes, nose, mouth, throat, skin, or other area.  Sore throat.  Headache.  Decreased sense of smell or taste. DIAGNOSIS Your caregiver will perform a physical exam and ask questions about the symptoms you are having.Allergy testing may be done to determine exactly what triggers your hay fever.  TREATMENT   Over-the-counter medicines may help symptoms. These include:  Antihistamines.  Decongestants. These may help with nasal congestion.  Your caregiver may prescribe medicines if over-the-counter medicines do not work.  Some people benefit from allergy shots when other medicines are not helpful. HOME CARE INSTRUCTIONS   Avoid the allergen that is causing your symptoms, if possible.  Take all medicine as told by your caregiver. SEEK MEDICAL CARE IF:   You have severe allergy symptoms and your current medicines are not helping.  Your treatment was working at one time, but you are now experiencing symptoms.  You have sinus congestion and pressure.  You develop a fever or headache.  You have thick nasal discharge.  You have asthma and have a worsening cough and wheezing. SEEK IMMEDIATE MEDICAL CARE IF:   You have swelling of your tongue or lips.  You have trouble breathing.  You feel lightheaded or like you are going to faint.  You have cold sweats.  You have a fever. Document Released: 07/08/2005 Document Revised: 09/30/2011 Document Reviewed: 10/03/2010 [employer] Patient Information 2014  [employer].  

## 2013-12-01 ENCOUNTER — Ambulatory Visit: Payer: BC Managed Care – PPO | Admitting: Family

## 2015-01-19 ENCOUNTER — Telehealth: Payer: Self-pay | Admitting: Nurse Practitioner

## 2015-01-19 NOTE — Telephone Encounter (Signed)
FYI

## 2015-01-19 NOTE — Telephone Encounter (Signed)
Patient Name: Shelly [name]  DOB: 1981/06/18    Initial Comment caller states her eyes are itching, irritated and uncomfortable   Nurse Assessment  Nurse: Sherilyn CooterHenry, RN, Thurmond ButtsWade Date/Time Lamount Cohen(Eastern Time): 01/19/2015 8:48:41 AM  Confirm and document reason for call. If symptomatic, describe symptoms. ---Caller states that her eyes are itchy and uncomfortable. She does have allergies, but they don't usually feel like this. She denies drainage. She does not wear contacts. She states that her eyes feel dry and tight. No swelling of the eyelids and they are not red in color. Denies fever. Denies pain. She has taken Allegra which hasn't helped.  Has the patient traveled out of the country within the last 30 days? ---No  Does the patient require triage? ---Yes  Related visit to physician within the last 2 weeks? ---No  Does the PT have any chronic conditions? (i.e. diabetes, asthma, etc.) ---Yes  List chronic conditions. ---Allergies  Did the patient indicate they were pregnant? ---No     Guidelines    Guideline Title Affirmed Question Affirmed Notes  Eye - Allergy [1] Taking allergy medicine > 2 days AND [2] eyes are very itchy    Final Disposition User   See PCP When Office is Open (within 3 days) Sherilyn CooterHenry, RN, [employer]ade    Comments  Caller states that she normally goes to [facility], but [facility] is [facility] an appointment [facility]. She has not been there before, but they are closer to her. Appointment scheduled for tomorrow, 01/20/15 at 11:30am with Naomie Deanarrie Doss NP.

## 2015-01-20 ENCOUNTER — Ambulatory Visit: Payer: Self-pay | Admitting: Nurse Practitioner

## 2015-01-27 NOTE — Telephone Encounter (Signed)
Pt cancelled appointment.

## 2015-04-19 ENCOUNTER — Encounter: Payer: Self-pay | Admitting: Family Medicine

## 2015-04-19 ENCOUNTER — Ambulatory Visit (INDEPENDENT_AMBULATORY_CARE_PROVIDER_SITE_OTHER): Payer: BLUE CROSS/BLUE SHIELD | Admitting: Family Medicine

## 2015-04-19 VITALS — BP 118/82 | HR 109 | Temp 98.2°F | Resp 18 | Wt 243.6 lb

## 2015-04-19 DIAGNOSIS — E049 Nontoxic goiter, unspecified: Secondary | ICD-10-CM

## 2015-04-19 DIAGNOSIS — L308 Other specified dermatitis: Secondary | ICD-10-CM | POA: Insufficient documentation

## 2015-04-19 DIAGNOSIS — Z13220 Encounter for screening for lipoid disorders: Secondary | ICD-10-CM | POA: Diagnosis not present

## 2015-04-19 DIAGNOSIS — IMO0001 Reserved for inherently not codable concepts without codable children: Secondary | ICD-10-CM | POA: Insufficient documentation

## 2015-04-19 DIAGNOSIS — Z136 Encounter for screening for cardiovascular disorders: Secondary | ICD-10-CM | POA: Diagnosis not present

## 2015-04-19 DIAGNOSIS — E040 Nontoxic diffuse goiter: Secondary | ICD-10-CM

## 2015-04-19 DIAGNOSIS — R0602 Shortness of breath: Secondary | ICD-10-CM | POA: Diagnosis not present

## 2015-04-19 NOTE — Patient Instructions (Signed)
Fat and Cholesterol Control Diet Fat and cholesterol levels in your blood and organs are influenced by your diet. High levels of fat and cholesterol may lead to diseases of the heart, small and large blood vessels, gallbladder, liver, and pancreas. CONTROLLING FAT AND CHOLESTEROL WITH DIET Although exercise and lifestyle factors are important, your diet is key. That is because certain foods are known to raise cholesterol and others to lower it. The goal is to balance foods for their effect on cholesterol and more importantly, to replace saturated and trans fat with other types of fat, such as monounsaturated fat, polyunsaturated fat, and omega-3 fatty acids. On average, a person should consume no more than 15 to 17 g of saturated fat daily. Saturated and trans fats are considered "bad" fats, and they will raise LDL cholesterol. Saturated fats are primarily found in animal products such as meats, butter, and cream. However, that does not mean you need to give up all your favorite foods. Today, there are good tasting, low-fat, low-cholesterol substitutes for most of the things you like to eat. Choose low-fat or nonfat alternatives. Choose round or loin cuts of red meat. These types of cuts are lowest in fat and cholesterol. Chicken (without the skin), fish, veal, and ground turkey breast are great choices. Eliminate fatty meats, such as hot dogs and salami. Even shellfish have little or no saturated fat. Have a 3 oz (85 g) portion when you eat lean meat, poultry, or fish. Trans fats are also called "partially hydrogenated oils." They are oils that have been scientifically manipulated so that they are solid at room temperature resulting in a longer shelf life and improved taste and texture of foods in which they are added. Trans fats are found in stick margarine, some tub margarines, cookies, crackers, and baked goods.  When baking and cooking, oils are a great substitute for butter. The monounsaturated oils are  especially beneficial since it is believed they lower LDL and raise HDL. The oils you should avoid entirely are saturated tropical oils, such as coconut and palm.  Remember to eat a lot from food groups that are naturally free of saturated and trans fat, including fish, fruit, vegetables, beans, grains (barley, rice, couscous, bulgur wheat), and pasta (without cream sauces).  IDENTIFYING FOODS THAT LOWER FAT AND CHOLESTEROL  Soluble fiber may lower your cholesterol. This type of fiber is found in fruits such as apples, vegetables such as broccoli, potatoes, and carrots, legumes such as beans, peas, and lentils, and grains such as barley. Foods fortified with plant sterols (phytosterol) may also lower cholesterol. You should eat at least 2 g per day of these foods for a cholesterol lowering effect.  Read package labels to identify low-saturated fats, trans fat free, and low-fat foods at the supermarket. Select cheeses that have only 2 to 3 g saturated fat per ounce. Use a heart-healthy tub margarine that is free of trans fats or partially hydrogenated oil. When buying baked goods (cookies, crackers), avoid partially hydrogenated oils. Breads and muffins should be made from whole grains (whole-wheat or whole oat flour, instead of "flour" or "enriched flour"). Buy non-creamy canned soups with reduced salt and no added fats.  FOOD PREPARATION TECHNIQUES  Never deep-fry. If you must fry, either stir-fry, which uses very little fat, or use non-stick cooking sprays. When possible, broil, bake, or roast meats, and steam vegetables. Instead of putting butter or margarine on vegetables, use lemon and herbs, applesauce, and cinnamon (for squash and sweet potatoes). Use nonfat   yogurt, salsa, and low-fat dressings for salads.  LOW-SATURATED FAT / LOW-FAT FOOD SUBSTITUTES Meats / Saturated Fat (g)  Avoid: Steak, marbled (3 oz/85 g) / 11 g  Choose: Steak, lean (3 oz/85 g) / 4 g  Avoid: Hamburger (3 oz/85 g) / 7  g  Choose: Hamburger, lean (3 oz/85 g) / 5 g  Avoid: Ham (3 oz/85 g) / 6 g  Choose: Ham, lean cut (3 oz/85 g) / 2.4 g  Avoid: Chicken, with skin, dark meat (3 oz/85 g) / 4 g  Choose: Chicken, skin removed, dark meat (3 oz/85 g) / 2 g  Avoid: Chicken, with skin, light meat (3 oz/85 g) / 2.5 g  Choose: Chicken, skin removed, light meat (3 oz/85 g) / 1 g Dairy / Saturated Fat (g)  Avoid: Whole milk (1 cup) / 5 g  Choose: Low-fat milk, 2% (1 cup) / 3 g  Choose: Low-fat milk, 1% (1 cup) / 1.5 g  Choose: Skim milk (1 cup) / 0.3 g  Avoid: Hard cheese (1 oz/28 g) / 6 g  Choose: Skim milk cheese (1 oz/28 g) / 2 to 3 g  Avoid: Cottage cheese, 4% fat (1 cup) / 6.5 g  Choose: Low-fat cottage cheese, 1% fat (1 cup) / 1.5 g  Avoid: Ice cream (1 cup) / 9 g  Choose: Sherbet (1 cup) / 2.5 g  Choose: Nonfat frozen yogurt (1 cup) / 0.3 g  Choose: Frozen fruit bar / trace  Avoid: Whipped cream (1 tbs) / 3.5 g  Choose: Nondairy whipped topping (1 tbs) / 1 g Condiments / Saturated Fat (g)  Avoid: Mayonnaise (1 tbs) / 2 g  Choose: Low-fat mayonnaise (1 tbs) / 1 g  Avoid: Butter (1 tbs) / 7 g  Choose: Extra light margarine (1 tbs) / 1 g  Avoid: Coconut oil (1 tbs) / 11.8 g  Choose: Olive oil (1 tbs) / 1.8 g  Choose: Corn oil (1 tbs) / 1.7 g  Choose: Safflower oil (1 tbs) / 1.2 g  Choose: Sunflower oil (1 tbs) / 1.4 g  Choose: Soybean oil (1 tbs) / 2.4 g  Choose: Canola oil (1 tbs) / 1 g Document Released: 07/08/2005 Document Revised: 11/02/2012 Document Reviewed: 10/06/2013 ExitCare Patient Information 2015 [employer]. This information is not intended to replace advice given to you by your health care provider. Make sure you discuss any questions you have with your health care provider.  

## 2015-04-19 NOTE — Progress Notes (Signed)
Name: Shelly [name]   MRN: [id number]    DOB: December 26, 1980   Date:04/19/2015       Progress Note  Subjective  Chief Complaint  Chief Complaint  Patient presents with  . Establish Care  . Referral    patient [facility] her [facility] concerns    HPI  Shelly [name] is a 34 y.o. female here today to transition care of medical needs to a primary care Trishia Cuthrell. The patient is appropriately concerned about their weight. Onset of weight issues started mid 20s. Highest recorded weight is current weight of 243 lbs. Associated symptoms or diseases include pain in weight bearing joints, excessive hair, skin lesions, poor self esteem, snoring, shortness of breath when talking on the phone and walking from car to her home. Current efforts to reduce weight include none consistently but she is trying to exercise and get nutritional counseling.  She lives by her self and states she has no excuse. Overall has poor eating habits, large portions of heavy carbohydrates, fats.     Active Ambulatory Problems    Diagnosis Date Noted  . Allergic rhinitis 11/11/2012  . Obesity, Class II, BMI 35-39.9, with comorbidity 04/19/2015  . Shortness of breath on exertion 04/19/2015  . Psoriasiform eczema 04/19/2015  . Encounter for cholesteral screening for cardiovascular disease 04/19/2015  . Goiter diffuse 04/19/2015   Resolved Ambulatory Problems    Diagnosis Date Noted  . No Resolved Ambulatory Problems   Past Medical History  Diagnosis Date  . Allergy   . History of kidney stones 2008  . Depression   . Eye strain     Social History  Substance Use Topics  . Smoking status: Never Smoker   . Smokeless tobacco: Not on file  . Alcohol Use: 0.0 oz/week    0 Standard drinks or equivalent per week     Comment: occasional     Current outpatient prescriptions:  .  Carboxymethylcellul-Glycerin (LUBRICATING EYE DROPS OP), Apply to eye., Disp: , Rfl:  .  fexofenadine  (ALLEGRA) 180 MG tablet, Take 180 mg by mouth daily., Disp: , Rfl:  .  fluticasone (FLONASE) 50 MCG/ACT nasal spray, Place into both nostrils daily., Disp: , Rfl:  .  ketotifen (ZADITOR) 0.025 % ophthalmic solution, Place 1 drop into both eyes 2 (two) times daily., Disp: , Rfl:   Past Surgical History  Procedure Laterality Date  . Wisdom tooth extraction  2011    Family History  Problem Relation Age of Onset  . Hypertension Mother   . Arthritis Maternal Grandmother   . Asthma Maternal Grandmother   . Diabetes Maternal Grandmother   . Hypertension Maternal Grandmother   . Arthritis Maternal Grandfather   . Hypertension Maternal Grandfather   . Arthritis Paternal Grandmother   . Hypertension Paternal Grandmother   . Hyperlipidemia Paternal Grandmother   . Heart attack Paternal Grandmother     06/2014; received 5 stents    No Known Allergies   Review of Systems  CONSTITUTIONAL: No significant weight changes, fever, chills, weakness or fatigue.  HEENT:  - Eyes: No visual changes.  - Ears: No auditory changes. No pain.  - Nose: No sneezing, congestion, runny nose. - Throat: No sore throat. No changes in swallowing. SKIN: No rash or itching. Tendency for dry skin. CARDIOVASCULAR: No chest pain, chest pressure or chest discomfort. No palpitations or edema.  RESPIRATORY: No shortness of breath, cough or sputum.  GASTROINTESTINAL: No anorexia, nausea, vomiting. No changes  in bowel habits. No abdominal pain or blood.  GENITOURINARY: No dysuria. No frequency. No discharge.  NEUROLOGICAL: No headache, dizziness, syncope, paralysis, ataxia, numbness or tingling in the extremities. No memory changes. No change in bowel or bladder control.  MUSCULOSKELETAL: No joint pain. No muscle pain. HEMATOLOGIC: No anemia, bleeding or bruising.  LYMPHATICS: No enlarged lymph nodes.  PSYCHIATRIC: No change in mood. No change in sleep pattern.  ENDOCRINOLOGIC: No reports of sweating, cold or heat  intolerance. No polyuria or polydipsia.     Objective  BP 118/82 mmHg  Pulse 109  Temp(Src) 98.2 F (36.8 C) (Oral)  Resp 18  Wt 243 lb 9.6 oz (110.496 kg)  SpO2 97%  LMP 04/13/2015 (Approximate) Body mass index is 37.57 kg/(m^2).  Physical Exam  Constitutional: Patient is obese and well-nourished. In no distress.  HEENT:  - Head: Normocephalic and atraumatic.  - Ears: Bilateral TMs gray, no erythema or effusion - Nose: Nasal mucosa moist - Mouth/Throat: Oropharynx is clear and moist. No tonsillar hypertrophy or erythema. No post nasal drainage.  - Eyes: Conjunctivae clear, EOM movements normal. PERRLA. No scleral icterus.  Neck: Normal range of motion. Neck supple. No JVD present. Mild diffuse thyromegaly present.  Cardiovascular: Normal rate, regular rhythm and normal heart sounds.  No murmur heard.  Pulmonary/Chest: Effort normal and breath sounds normal. No respiratory distress. Musculoskeletal: Normal range of motion bilateral UE and LE, no joint effusions. Peripheral vascular: Bilateral LE no edema. Neurological: CN II-XII grossly intact with no focal deficits. Alert and oriented to person, place, and time. Coordination, balance, strength, speech and gait are normal.  Skin: Skin is warm and dry. No rash noted. No erythema.  Psychiatric: Patient has a normal mood and affect. Behavior is normal in office today. Judgment and thought content normal in office today.   Assessment & Plan  1. Obesity, Class II, BMI 35-39.9, with comorbidity The patient has been counseled on their higher than normal BMI.  They have verbally expressed understanding their increased risk for other diseases.  In efforts to meet a better target BMI goal the patient has been counseled on lifestyle, diet, exercise modification tactics and behavioral changes (Use stairs instead of elevators, park furthest end of parking lots, do not buy or stock junk food in the home, etc.). Start with moderate intensity  aerobic exercise (walking, jogging, elliptical, swimming, group or individual sports, hiking) at least a day at least 4 days a week and increase intensity, duration, frequency as tolerated. Diet should include well balance fresh fruits and vegetables avoiding processed foods, carbohydrates and sugars. Drink at least 8oz 10 glasses a day avoiding sodas, sugary fruit drinks, sweetened tea. Check weight on a reliable scale daily and monitor weight loss progress daily. Consider investing in mobile phone apps that will help keep track of weight loss goals. Given information on Saxenda, [place], [place].   - CBC with Differential/Platelet - Comprehensive metabolic panel - Hemoglobin A1c - Amb ref to Medical Nutrition Therapy-MNT  2. Shortness of breath on exertion Likely due to obesity. BP good today, lung exam clear. May benefit from sleep study near future.  - CBC with Differential/Platelet - Comprehensive metabolic panel - Hemoglobin A1c  3. Goiter diffuse Will assess for thyroid dysfunction.   - TSH  4. Encounter for cholesteral screening for cardiovascular disease  - Lipid panel

## 2015-04-29 LAB — CBC WITH DIFFERENTIAL/PLATELET
BASOS ABS: 0 10*3/uL (ref 0.0–0.2)
Basos: 0 %
EOS (ABSOLUTE): 0 10*3/uL (ref 0.0–0.4)
Eos: 1 %
Hematocrit: 40.2 % (ref 34.0–46.6)
Hemoglobin: 13.2 g/dL (ref 11.1–15.9)
Immature Grans (Abs): 0 10*3/uL (ref 0.0–0.1)
Immature Granulocytes: 0 %
LYMPHS ABS: 1.9 10*3/uL (ref 0.7–3.1)
Lymphs: 29 %
MCH: 23.9 pg — AB (ref 26.6–33.0)
MCHC: 32.8 g/dL (ref 31.5–35.7)
MCV: 73 fL — ABNORMAL LOW (ref 79–97)
Monocytes Absolute: 0.4 10*3/uL (ref 0.1–0.9)
Monocytes: 6 %
NEUTROS ABS: 4.2 10*3/uL (ref 1.4–7.0)
Neutrophils: 64 %
PLATELETS: 290 10*3/uL (ref 150–379)
RBC: 5.53 x10E6/uL — AB (ref 3.77–5.28)
RDW: 16.8 % — AB (ref 12.3–15.4)
WBC: 6.5 10*3/uL (ref 3.4–10.8)

## 2015-04-29 LAB — COMPREHENSIVE METABOLIC PANEL
ALK PHOS: 55 IU/L (ref 39–117)
ALT: 12 IU/L (ref 0–32)
AST: 13 IU/L (ref 0–40)
Albumin/Globulin Ratio: 1.9 (ref 1.1–2.5)
Albumin: 4.4 g/dL (ref 3.5–5.5)
BUN / CREAT RATIO: 10 (ref 8–20)
BUN: 9 mg/dL (ref 6–20)
CHLORIDE: 104 mmol/L (ref 97–108)
CO2: 23 mmol/L (ref 18–29)
Calcium: 9.7 mg/dL (ref 8.7–10.2)
Creatinine, Ser: 0.92 mg/dL (ref 0.57–1.00)
GFR calc Af Amer: 93 mL/min/{1.73_m2} (ref >59)
GFR calc non Af Amer: 80 mL/min/{1.73_m2} (ref >59)
GLUCOSE: 89 mg/dL (ref 65–99)
Globulin, Total: 2.3 g/dL (ref 1.5–4.5)
Potassium: 4.7 mmol/L (ref 3.5–5.2)
Sodium: 142 mmol/L (ref 134–144)
Total Protein: 6.7 g/dL (ref 6.0–8.5)

## 2015-04-29 LAB — LIPID PANEL
CHOL/HDL RATIO: 3 ratio (ref 0.0–4.4)
CHOLESTEROL TOTAL: 219 mg/dL — AB (ref 100–199)
HDL: 72 mg/dL (ref >39)
LDL Calculated: 135 mg/dL — ABNORMAL HIGH (ref 0–99)
TRIGLYCERIDES: 60 mg/dL (ref 0–149)
VLDL Cholesterol Cal: 12 mg/dL (ref 5–40)

## 2015-04-29 LAB — HEMOGLOBIN A1C
ESTIMATED AVERAGE GLUCOSE: 117 mg/dL
HEMOGLOBIN A1C: 5.7 % — AB (ref 4.8–5.6)

## 2015-04-29 LAB — TSH: TSH: 1.05 u[IU]/mL (ref 0.450–4.500)

## 2015-05-26 ENCOUNTER — Encounter: Payer: Self-pay | Admitting: Dietician

## 2015-05-26 ENCOUNTER — Encounter: Payer: BLUE CROSS/BLUE SHIELD | Attending: Family Medicine | Admitting: Dietician

## 2015-05-26 VITALS — Ht 67.5 in | Wt 243.0 lb

## 2015-05-26 DIAGNOSIS — E669 Obesity, unspecified: Secondary | ICD-10-CM | POA: Diagnosis not present

## 2015-05-26 NOTE — Progress Notes (Signed)
Medical Nutrition Therapy: Visit start time: 8:15   end time: 9;15 Assessment:  Diagnosis: obesity Past medical history: pre-diabetes (HgA1c-5.7), elevated cholesterol T.Chol- 219, LDL 135, HDL 72 Psychosocial issues/ stress concerns: none identified Preferred learning method:  Jill Alexanders. Visual . Hands-on  Current weight: 243 lbs  Height: 67. 5 in Medications, supplements: see list Progress and evaluation:  Patient in for initial nutrition assessment.  She reports she has been making some positive diet and exercise changes. She has decreased her intake of high fat "fast foods", has decreased candy intake and is not drinking any beverages with sugar. She expressed frustration at the lack of weight loss even with these changes. She states that she loves sweets and co-worker keeps a bowl of candy and she continues to eat candy for snacks but is eating less and not taking home as much.  Physical activity: cardio at gym 2-3 days/week for 45 minutes  Dietary Intake:  Usual eating pattern includes 3 meals and 2-3 snacks per day. Dining out frequency: 6 meals per week.  Breakfast: oatmeal with dried fruit/nuts Snack: candy Lunch: Eats at cafeteria for most lunches; is now choosing an entree and 2 vegetables; does not usually eat bread, water Snack: candy Supper: Has been making salads for dinner and putting chicken salad on them (1/3-1/2 cup).  Snack: candy Beverages: water;sometimes flavored and carbonated but sugar-free   Nutrition Care Education:  Weight control: Instructed on a meal plan based on 1600 calories to promote weight loss including carbohydrate counting and need to balance carbohydrate, protein and non-starchy vegetables as well as portion control and basic label reading. Pre-diabetes: Discussed glucose and HgA1c lab values and the role of diet and exercise in decreasing risk of Type 2 diabetes.   Nutritional Diagnosis:  [place]-3.3 Overweight/obesity As related to history of frequent high  fat foods and sweets intake .  As evidenced by diet history and current weight..  Intervention:  Balance meals with protein, 2-3 servings of carbohydrate and "free vegetables". Estimate carbohydrate servings at meals and snacks. Spread 11 servings of carbohydrate over 3 meals and 1-2 snacks. Measure some portions of carbohydrate foods, especially starches. Read labels for carbohydrate grams remembering that 15 gms of carbohydrate equals one serving. Work toward exercise goal of 4 days per week for at least 30 minutes. Plan out evening meals. Portion foods for evening meal and freeze.    Education Materials given:  . Food lists/ Planning A Balanced Meal . Sample meal pattern/ menus . Goals/ instructions  Learner/ who was taught:  . Patient   Level of understanding: . Partial understanding; needs review/ practice Learning barriers: . None  Willingness to learn/ readiness for change: . Eager, change in progress Monitoring and Evaluation:  Dietary intake, exercise,  and body weight      follow up: 06/26/15 at 3:00pm

## 2015-05-26 NOTE — Patient Instructions (Signed)
Balance meals with protein, 2-3 servings of carbohydrate and "free vegetables". Estimate carbohydrate servings at meals and snacks. Spread 11 servings of carbohydrate over 3 meals and 1-2 snacks. Measure some portions of carbohydrate foods, especially starches. Read labels for carbohydrate grams remembering that 15 gms of carbohydrate equals one serving. Work toward exercise goal of 4 days per week for at least 30 minutes. Plan out evening meals. Portion foods for evening meal and freeze.

## 2015-05-29 ENCOUNTER — Telehealth: Payer: Self-pay | Admitting: Family Medicine

## 2015-05-29 NOTE — Telephone Encounter (Signed)
Have reviewed the weight loss medication and have decided that she would like to try phentermine but would like to discuss it with you. Please return call.

## 2015-05-30 NOTE — Telephone Encounter (Signed)
Patient was informed and agreed to Dr. Debby FreibergSundaram's requirements and recommendations.

## 2015-05-30 NOTE — Telephone Encounter (Signed)
Please let patient know that Phentermine 37.5 mg can be used once in the morning daily in addition to diet changes and daily exercise to help with weight loss. This medication has been linked to cardiovascular disorders and so I only prescribe it for 3 consecutive months and monitory my patients closely with office visits. Often there is a yo-yo effect where people lose and regain weight on and off this medication which is not a healthy way to manage one's weight goals. Sustained dietary changes, daily exercise and lifestyle changes are the only way to keep weight off.   This prescription has to be printed out and signed, which I can do when I get back to the office.  She should plan to follow up with me 1 month after starting the medication as she should have lost at least 5% of her starting weight to qualify for continuing this medication.

## 2015-06-05 ENCOUNTER — Other Ambulatory Visit: Payer: Self-pay | Admitting: Family Medicine

## 2015-06-05 MED ORDER — PHENTERMINE HCL 37.5 MG PO TABS: 37.5000 mg | ORAL_TABLET | Freq: Every day | ORAL | Status: DC

## 2015-06-05 NOTE — Telephone Encounter (Signed)
Rx printed out today.

## 2015-06-05 NOTE — Telephone Encounter (Signed)
Patient was informed and stated that she will pick up her rx in the morning.

## 2015-06-13 ENCOUNTER — Telehealth: Payer: Self-pay

## 2015-06-13 NOTE — Telephone Encounter (Signed)
Just got a call from [place] with [facility], stating this patient picked up her rx for the phentermine last night and paid full price, but after she got home she found out that it is covered under her insurance if it has a prior authorization.  She gave me the information for her insurance company and the prior authorization line. [place] [employer] [place]  #[phone]      ID #[id number]  Dr. Sherley BoundsSundaram has been informed and I stated that I would see if Burnett HarryShelly could figure this out for the patient.

## 2015-06-14 NOTE — Telephone Encounter (Signed)
[email or web address] for approval or denial. Will notify the pt once we receive a determination

## 2015-06-19 ENCOUNTER — Telehealth: Payer: Self-pay

## 2015-06-19 NOTE — Telephone Encounter (Signed)
Spoke with patient and notified her we have [facility] a [facility] and [facility] approved her Prior Authorization for her Phenteramine. They are however limiting the course to 12 weeks. Pt verbalized understanding and thanked me for my help.

## 2015-06-26 ENCOUNTER — Ambulatory Visit: Payer: BLUE CROSS/BLUE SHIELD | Admitting: Dietician

## 2015-07-04 ENCOUNTER — Encounter: Payer: Self-pay | Admitting: Dietician

## 2015-07-21 ENCOUNTER — Ambulatory Visit: Payer: BLUE CROSS/BLUE SHIELD | Admitting: Family Medicine

## 2016-05-30 ENCOUNTER — Encounter: Payer: Self-pay | Admitting: Family Medicine

## 2016-05-30 ENCOUNTER — Ambulatory Visit (INDEPENDENT_AMBULATORY_CARE_PROVIDER_SITE_OTHER): Payer: BLUE CROSS/BLUE SHIELD | Admitting: Family Medicine

## 2016-05-30 VITALS — BP 140/80 | HR 89 | Temp 98.7°F | Resp 12 | Ht 67.5 in | Wt 254.0 lb

## 2016-05-30 DIAGNOSIS — Z23 Encounter for immunization: Secondary | ICD-10-CM | POA: Diagnosis not present

## 2016-05-30 DIAGNOSIS — Z0289 Encounter for other administrative examinations: Secondary | ICD-10-CM | POA: Diagnosis not present

## 2016-05-30 DIAGNOSIS — D649 Anemia, unspecified: Secondary | ICD-10-CM | POA: Diagnosis not present

## 2016-05-30 DIAGNOSIS — E6609 Other obesity due to excess calories: Secondary | ICD-10-CM

## 2016-05-30 DIAGNOSIS — E785 Hyperlipidemia, unspecified: Secondary | ICD-10-CM | POA: Insufficient documentation

## 2016-05-30 DIAGNOSIS — Z6839 Body mass index (BMI) 39.0-39.9, adult: Secondary | ICD-10-CM | POA: Diagnosis not present

## 2016-05-30 LAB — CBC
HCT: 41.1 % (ref 36.0–46.0)
Hemoglobin: 13.1 g/dL (ref 12.0–15.0)
MCHC: 31.9 g/dL (ref 30.0–36.0)
MCV: 75 fl — ABNORMAL LOW (ref 78.0–100.0)
Platelets: 290 10*3/uL (ref 150.0–400.0)
RBC: 5.47 Mil/uL — AB (ref 3.87–5.11)
RDW: 16.2 % — AB (ref 11.5–15.5)
WBC: 6.3 10*3/uL (ref 4.0–10.5)

## 2016-05-30 LAB — BASIC METABOLIC PANEL
BUN: 10 mg/dL (ref 6–23)
CALCIUM: 9.6 mg/dL (ref 8.4–10.5)
CO2: 28 meq/L (ref 19–32)
CREATININE: 0.79 mg/dL (ref 0.40–1.20)
Chloride: 105 mEq/L (ref 96–112)
GFR: 105.08 mL/min (ref >60.00)
GLUCOSE: 82 mg/dL (ref 70–99)
Potassium: 4.1 mEq/L (ref 3.5–5.1)
Sodium: 139 mEq/L (ref 135–145)

## 2016-05-30 LAB — LIPID PANEL
CHOL/HDL RATIO: 3
CHOLESTEROL: 223 mg/dL — AB (ref 0–200)
HDL: 65.7 mg/dL (ref >39.00)
LDL CALC: 145 mg/dL — AB (ref 0–99)
NONHDL: 156.87
Triglycerides: 57 mg/dL (ref 0.0–149.0)
VLDL: 11.4 mg/dL (ref 0.0–40.0)

## 2016-05-30 LAB — TSH: TSH: 0.72 u[IU]/mL (ref 0.35–4.50)

## 2016-05-30 LAB — HEMOGLOBIN A1C: Hgb A1c MFr Bld: 5.6 % (ref 4.6–6.5)

## 2016-05-30 LAB — FERRITIN: FERRITIN: 13 ng/mL (ref 10.0–291.0)

## 2016-05-30 NOTE — Progress Notes (Signed)
HPI:   [name] is a 35 y.o. female, who is here today to establish care with me.  Former PCP: Ms Shelly [name]  Last preventive routine visit: 05/2015  Concerns today: She needs foster form fill out.   She is planning on being a foster parent, she applied for a max of 2 children. She lives alone. She has not children. She denies Hx of chronic medical conditions. In the past she has been Dx with anxiety, denies any current symptom, and was not on pharmacologic treatment. She denies Hx of depression.  She is not following a healthful diet and does not exercise regularly.  In the past her cholesterol has been elevated, recommended non pharmacologic treatment.  Hx of anemia, she has Hx of heavy menses, describes her menstrual periods as normal. She is not on iron supplementation. She denies headaches, fatigue, chest pain, dyspnea, palpitations, or dizziness.  Denies abdominal pain, nausea, vomiting, changes in bowel habits, blood in stool or melena. She has not noted gross hematuria or urinary symptoms.   Lab Results  Component Value Date   TSH 1.050 04/28/2015   Lab Results  Component Value Date   WBC 6.5 04/28/2015   HGB 10.8 (L) 11/11/2012   HCT 40.2 04/28/2015   MCV 73 (L) 04/28/2015   PLT 290 04/28/2015   No Hx of communicable diseases or overseas travel. Hx of allergies: allergic conjunctivitis and rhinitis.   Review of Systems  Constitutional: Negative for activity change, appetite change, fatigue, fever and unexpected weight change.  HENT: Negative for mouth sores, nosebleeds and trouble swallowing.   Eyes: Negative for discharge and visual disturbance.  Respiratory: Negative for cough, shortness of breath and wheezing.   Cardiovascular: Negative for chest pain, palpitations and leg swelling.  Gastrointestinal: Negative for abdominal pain, nausea and vomiting.       Negative for changes in bowel habits.  Genitourinary: Negative for decreased  urine volume, difficulty urinating, dysuria and hematuria.  Skin: Negative for rash.  Neurological: Negative for syncope, weakness, numbness and headaches.  Hematological: Negative for adenopathy. Does not bruise/bleed easily.  Psychiatric/Behavioral: Negative for behavioral problems, confusion and sleep disturbance. The patient is not nervous/anxious.       Current Outpatient Prescriptions on File Prior to Visit  Medication Sig Dispense Refill  . Carboxymethylcellul-Glycerin (LUBRICATING EYE DROPS OP) Apply to eye.    . fexofenadine (ALLEGRA) 180 MG tablet Take 180 mg by mouth daily.    . fluticasone (FLONASE) 50 MCG/ACT nasal spray Place into both nostrils daily.    Marland Kitchen. ketotifen (ZADITOR) 0.025 % ophthalmic solution Place 1 drop into both eyes 2 (two) times daily.     No current facility-administered medications on file prior to visit.      Past Medical History:  Diagnosis Date  . Allergy   . Depression    history of   . Eye strain    patient has digitial eye strain that is being address at [facility]  . History of kidney stones 2008   No Known Allergies  Family History  Problem Relation Age of Onset  . Hypertension Mother   . Arthritis Maternal Grandmother   . Asthma Maternal Grandmother   . Diabetes Maternal Grandmother   . Hypertension Maternal Grandmother   . Arthritis Maternal Grandfather   . Hypertension Maternal Grandfather   . Arthritis Paternal Grandmother   . Hypertension Paternal Grandmother   . Hyperlipidemia Paternal Grandmother   . Heart attack Paternal Grandmother  06/2014; received 5 stents    Social History   Social History  . Marital status: Single    Spouse name: N/A  . Number of children: N/A  . Years of education: N/A   Social History Main Topics  . Smoking status: Never Smoker  . Smokeless tobacco: None  . Alcohol use 0.0 oz/week     Comment: occasional  . Drug use: No  . Sexual activity: No   Other Topics Concern  .  None   Social History Narrative  . None    Vitals:   05/30/16 0854  BP: 140/80  Pulse: 89  Temp: 98.7 F (37.1 C)   O2 sat 98% at RA.   Body mass index is 39.19 kg/m.    Physical Exam  Nursing note and vitals reviewed. Constitutional: She is oriented to person, place, and time. She appears well-developed. No distress.  HENT:  Head: Atraumatic.  Mouth/Throat: Oropharynx is clear and moist and mucous membranes are normal.  Eyes: Conjunctivae and EOM are normal. Pupils are equal, round, and reactive to light.  Neck: No tracheal deviation present. Thyromegaly (palpable, mildly enlarged) present. No thyroid mass present.  Cardiovascular: Normal rate and regular rhythm.   No murmur heard. Pulses:      Dorsalis pedis pulses are 2+ on the right side, and 2+ on the left side.  Respiratory: Effort normal and breath sounds normal. No respiratory distress.  GI: Soft. She exhibits no mass. There is no hepatomegaly. There is no tenderness.  Musculoskeletal: She exhibits no edema or tenderness.  Lymphadenopathy:    She has no cervical adenopathy.  Neurological: She is alert and oriented to person, place, and time. She has normal strength. Coordination normal.  Skin: Skin is warm. No erythema.  Psychiatric: She has a normal mood and affect.  Well groomed, good eye contact.      ASSESSMENT AND PLAN:     Shelly [name] was seen today for establish care.  Diagnoses and all orders for this visit:   Lab Results  Component Value Date   WBC 6.3 05/30/2016   HGB 13.1 05/30/2016   HCT 41.1 05/30/2016   MCV 75.0 (L) 05/30/2016   PLT 290.0 05/30/2016   Lab Results  Component Value Date   CREATININE 0.79 05/30/2016   BUN 10 05/30/2016   NA 139 05/30/2016   K 4.1 05/30/2016   CL 105 05/30/2016   CO2 28 05/30/2016   Lab Results  Component Value Date   HGBA1C 5.6 05/30/2016   Lab Results  Component Value Date   CHOL 223 (H) 05/30/2016   HDL 65.70 05/30/2016   LDLCALC 145 (H)  05/30/2016   TRIG 57.0 05/30/2016   CHOLHDL 3 05/30/2016     Encounter for physical examination of prospective foster parent  In general she does not have psychiatric or chronic problems that preclude her from being a foster parent. She understands the responsibilities and looking forward to starting process.  Form reviewed and filled out during OV.   Anemia, unspecified type  Asymptomatic. Further recommendations will be given according to lab results.  -     CBC -     Ferritin -     TSH  Hyperlipidemia, unspecified hyperlipidemia type  Continue non pharmacologic treatment. Further recommendations will be given according to lab results. F/U in 6-12 months.  -     Lipid panel  BMI 39.0-39.9,adult  We discussed benefits of wt loss as well as adverse effects of obesity. Consistency with healthy diet  and physical activity recommended. If interested Weight Watchers is a good option as well as daily brisk walking for 15-30 min as tolerated. Cooking at home and controlling portions may also help.   -     Basic metabolic panel -     HgB A1c -     TSH  Need for immunization against influenza -     Flu Vaccine QUAD 36+ mos IM       Betty G. [place], MD  [facility]. [place] office.

## 2016-05-30 NOTE — Patient Instructions (Addendum)
A few things to remember from today's visit:   Anemia, unspecified type - Plan: CBC, Ferritin  Hyperlipidemia, unspecified hyperlipidemia type - Plan: Lipid panel  BMI 39.0-39.9,adult - Plan: Basic metabolic panel, HgB A1c  What are some tips for weight loss? People become overweight for many reasons. Weight issues can run in families. They can be caused by unhealthy behaviors and a person's environment. Certain health problems and medicines can also lead to weight gain. There are some simple things you can do to reach and maintain a healthy weight:  Eat small more frequent healthy meals instead 3 bid meals. Also Weight Watchers is a good option. Avoid sweet drinks. These include regular soft drinks, fruit juices, fruit drinks, energy drinks, sweetened iced tea, and flavored milk. Avoid fast foods. Fast foods such as french fries, hamburgers, chicken nuggets, and pizza are high in calories and can cause weight gain. Eat a healthy breakfast. People who skip breakfast tend to weigh more. Don't watch more than two hours of television per day. Chew sugar-free gum between meals to cut down on snacking. Avoid grocery shopping when you're hungry. Pack a healthy lunch instead of eating out to control what and how much you eat. Eat a lot of fruits and vegetables. Aim for about 2 cups of fruit and 2 to 3 cups of vegetables per day. Aim for 150 minutes per week of moderate-intensity exercise (such as brisk walking), or 75 minutes per week of vigorous exercise (such as jogging or running). OR 15-30 min of daily brisk walking. Be more active. Small changes in physical activity can easily be added to your daily routine. For example, take the stairs instead of the elevator. Take a walk with your family. A daily walk is a great way to get exercise and to catch up on the day's events.  Pap smear next year.  Monitor blood pressure at home.  Goal < 140/90 Please be sure medication list is accurate. If a  new problem present, please set up appointment sooner than planned today.

## 2016-06-02 ENCOUNTER — Encounter: Payer: Self-pay | Admitting: Family Medicine

## 2016-12-10 DIAGNOSIS — H6693 Otitis media, unspecified, bilateral: Secondary | ICD-10-CM | POA: Diagnosis not present

## 2016-12-10 DIAGNOSIS — Z6837 Body mass index (BMI) 37.0-37.9, adult: Secondary | ICD-10-CM | POA: Diagnosis not present

## 2017-01-13 ENCOUNTER — Encounter: Payer: Self-pay | Admitting: Family Medicine

## 2017-01-13 ENCOUNTER — Ambulatory Visit (INDEPENDENT_AMBULATORY_CARE_PROVIDER_SITE_OTHER): Payer: BLUE CROSS/BLUE SHIELD | Admitting: Family Medicine

## 2017-01-13 VITALS — BP 130/80 | HR 83 | Resp 12 | Ht 67.5 in | Wt 254.1 lb

## 2017-01-13 DIAGNOSIS — J309 Allergic rhinitis, unspecified: Secondary | ICD-10-CM | POA: Diagnosis not present

## 2017-01-13 DIAGNOSIS — J069 Acute upper respiratory infection, unspecified: Secondary | ICD-10-CM | POA: Diagnosis not present

## 2017-01-13 DIAGNOSIS — H6983 Other specified disorders of Eustachian tube, bilateral: Secondary | ICD-10-CM

## 2017-01-13 MED ORDER — MOMETASONE FUROATE 50 MCG/ACT NA SUSP: 2.0000 | Freq: Every day | NASAL | 3 refills | Status: DC

## 2017-01-13 MED ORDER — PREDNISONE 20 MG PO TABS: 40.0000 mg | ORAL_TABLET | Freq: Every day | ORAL | 0 refills | Status: AC

## 2017-01-13 NOTE — Patient Instructions (Signed)
A few things to remember from today's visit:   Allergic rhinitis, unspecified seasonality, unspecified trigger - Plan: predniSONE (DELTASONE) 20 MG tablet, mometasone (NASONEX) 50 MCG/ACT nasal spray  URI, acute  Eustachian tube dysfunction, bilateral  viral infections are self-limited and we treat each symptom depending of severity.  Over the counter medications as decongestants and cold medications usually help, they need to be taken with caution if there is a history of high blood pressure or palpitations. Tylenol and/or Ibuprofen also helps with most symptoms (headache, muscle aching, fever,etc) Plenty of fluids. Honey helps with cough. Steam inhalations helps with runny nose, nasal congestion, and may prevent sinus infections. Cough and nasal congestion could last a few days and sometimes weeks. Please follow in not any better in 1-2 weeks or if symptoms get worse.   There are 2 forms of allergic rhinitis: . Seasonal (hay fever): Caused by an allergy to pollen and/or mold spores in the air. Pollen is the fine powder that comes from the stamen of flowering plants. It can be carried through the air and is easily inhaled. Symptoms are seasonal and usually occur in spring, late summer, and fall. Marland Kitchen. Perennial: Caused by other allergens such as dust mites, pet hair or dander, or mold. Symptoms occur year-round.  Symptoms: Your symptoms can vary, depending on the severity of your allergies. Symptoms can include: Sneezing, coughing.itching (mostly eyes, nose, mouth, throat and skin),runny nose,stuffy nose.headache,pressure in the nose and cheeks,ear fullness and popping, sore throat.watery, red, or swollen eyes,dark circles under your eyes,trouble smelling, and sometimes hives.  Allergic rhinitis cannot be prevented. You can help your symptoms by avoiding the things that you are allergic, including: . Keeping windows closed. This is especially important during high-pollen seasons. . Washing  your hands after petting animals. . Using dust- and mite-proof bedding and mattress covers. . Wearing glasses outside to protect your eyes. . Showering before bed to wash off allergens from hair and skin. You can also avoid things that can make your symptoms worse, such as: . aerosol sprays . air pollution . cold temperatures . humidity . irritating fumes . tobacco smoke . wind . wood smoke.   Antihistamines help reduce the sneezing, runny nose, and itchiness of allergies. These come in pill form and as nasal sprays. Allegra,Zyrtec,or Claritin are some examples. Decongestants, such as pseudoephedrine and phenylephrine, help temporarily relieve the stuffy nose of allergies. Decongestants are found in many medicines and come as pills, nose sprays, and nose drops. They could increase heart rate and cause tachycardia and tremor. Nasal Afrin should not be used for more than 3 days because you can become dependent on them. This causes you to feel even more stopped-up when you try to quit using them.  Nasal sprays: steroids or antihistaminics. Over the counter intranasal sterids: Nasocort,Rhinocort,or Flonase.You won't notice their benefits for up to 2 weeks after starting them. Allergy shots or sublingual tablets when other treatment do not help.This is done by immunologists.   Please be sure medication list is accurate. If a new problem present, please set up appointment sooner than planned today.

## 2017-01-13 NOTE — Progress Notes (Signed)
HPI:   ACUTE VISIT:  No chief complaint on file.   Shelly [name] is a 36 y.o. female, who is here today complaining of a month of left ear "clogged." Problem is constant, not sure about exacerbating  Or alleviating factors.  According to pt, she was treated for an ear infection with Amoxicillin about 4 weeks ago, she was seen in a local acute care facility. Symptoms improved. 3-4 days ago she started with sore throat and nasal congestion. Not sure about fever.  Hx of allergic rhinitis. Cannot breath through nose.   URI   This is a recurrent problem. The current episode started in the past 7 days. The problem has been waxing and waning. Associated symptoms include congestion, coughing (non productive), a plugged ear sensation, rhinorrhea and a sore throat. Pertinent negatives include no abdominal pain, diarrhea, ear pain, headaches, nausea, neck pain, rash, sinus pain, sneezing, swollen glands, vomiting or wheezing. She has tried antihistamine and decongestant for the symptoms. The treatment provided mild relief.   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 No recent travel. No insect bites.  She took Careers adviserAllegra today. Flonase nasal spray for 5 days. Sudafed last night. Nasal Afrin yesterday and today, helps.    Review of Systems  Constitutional: Positive for fatigue. Negative for activity change, appetite change and fever.  HENT: Positive for congestion, postnasal drip, rhinorrhea and sore throat. Negative for ear pain, mouth sores, sinus pain, sinus pressure, sneezing, trouble swallowing and voice change.   Eyes: Negative for discharge, redness and itching.  Respiratory: Positive for cough (non productive). Negative for chest tightness, shortness of breath and wheezing.   Gastrointestinal: Negative for abdominal pain, diarrhea, nausea and vomiting.  Musculoskeletal: Negative for myalgias and neck pain.  Skin: Negative for rash.  Allergic/Immunologic:  Positive for environmental allergies.  Neurological: Negative for weakness and headaches.  Hematological: Negative for adenopathy. Does not bruise/bleed easily.      Current Outpatient Prescriptions on File Prior to Visit  Medication Sig Dispense Refill  . Carboxymethylcellul-Glycerin (LUBRICATING EYE DROPS OP) Apply to eye.    Marland Kitchen. ketotifen (ZADITOR) 0.025 % ophthalmic solution Place 1 drop into both eyes 2 (two) times daily.     No current facility-administered medications on file prior to visit.      Past Medical History:  Diagnosis Date  . Allergy   . Depression    history of   . Eye strain    patient has digitial eye strain that is being address at [facility]  . History of kidney stones 2008   No Known Allergies  Social History   Social History  . Marital status: Single    Spouse name: N/A  . Number of children: N/A  . Years of education: N/A   Social History Main Topics  . Smoking status: Never Smoker  . Smokeless tobacco: Never Used  . Alcohol use 0.0 oz/week     Comment: occasional  . Drug use: No  . Sexual activity: No   Other Topics Concern  . None   Social History Narrative  . None    Vitals:   01/13/17 1602  BP: 130/80  Pulse: 83  Resp: 12  O2 sat at RA 97% Body mass index is 39.21 kg/m.   Physical Exam  Nursing note and vitals reviewed. Constitutional: She is oriented to person, place, and time. She appears well-developed. She does not appear ill. No distress.  HENT:  Head: Atraumatic.  Right Ear: External ear and ear canal normal. Tympanic membrane is injected. Tympanic membrane is not erythematous. A middle ear effusion is present.  Left Ear: External ear and ear canal normal. A middle ear effusion is present.  Nose: Rhinorrhea present. Right sinus exhibits no maxillary sinus tenderness and no frontal sinus tenderness. Left sinus exhibits no maxillary sinus tenderness and no frontal sinus tenderness.  Mouth/Throat: Oropharynx  is clear and moist and mucous membranes are normal.  Nasal voice Post nasal drainage Tonsil stone left  Eyes: Conjunctivae and EOM are normal.  Neck: No muscular tenderness present.  Cardiovascular: Normal rate and regular rhythm.   No murmur heard. Respiratory: Effort normal and breath sounds normal. No respiratory distress.  Lymphadenopathy:       Head (right side): No submandibular adenopathy present.       Head (left side): No submandibular adenopathy present.    She has no cervical adenopathy.  Neurological: She is alert and oriented to person, place, and time. She has normal strength. Gait normal.  Skin: Skin is warm. No rash noted. No erythema.  Psychiatric: Her mood appears anxious.  Well groomed, good eye contact.     ASSESSMENT AND PLAN:   Diagnoses and all orders for this visit:  Allergic rhinitis, unspecified seasonality, unspecified trigger  Some of symptoms suggest mainly allergic component. OTC antihistaminic daily. Caution with Afrin nasal spray. Nasal irrigations with saline. Stop Flonase and try Nasonex. Agrees with short Prednisone course, some side effects discussed.        If not better in 3 weeks she was instructed to let me know, in which case we can try Astelin. States that Singulair has not       helped the past.  -     predniSONE (DELTASONE) 20 MG tablet; Take 2 tablets (40 mg total) by mouth daily with breakfast. -     mometasone (NASONEX) 50 MCG/ACT nasal spray; Place 2 sprays into the nose daily.  URI, acute  ? Fever, mild acute viral illness. Symptomatic treatment recommended at this time. I do not think abx will help now.  Eustachian tube dysfunction, bilateral  Autoinflation maneuvers recommended. OTC decongestants may help, some side effects discussed. Prednisone may help. If not resolved in 2 months she needs to follow, before if worsening. Instructed about warning signs.    -Ms.Shakeela [name] advised to seek immediate medical  attention if symptoms suddenly get worse or to follow if symptoms persist or new concerns arise.       Shelly Halm G. [place], MD  [facility]. [place] office.

## 2017-01-16 ENCOUNTER — Encounter: Payer: Self-pay | Admitting: Family Medicine

## 2017-01-16 DIAGNOSIS — J019 Acute sinusitis, unspecified: Secondary | ICD-10-CM | POA: Diagnosis not present

## 2017-01-16 DIAGNOSIS — J069 Acute upper respiratory infection, unspecified: Secondary | ICD-10-CM | POA: Diagnosis not present

## 2017-01-16 DIAGNOSIS — Z6837 Body mass index (BMI) 37.0-37.9, adult: Secondary | ICD-10-CM | POA: Diagnosis not present

## 2017-04-10 ENCOUNTER — Encounter: Payer: Self-pay | Admitting: Family Medicine

## 2017-06-17 DIAGNOSIS — H6692 Otitis media, unspecified, left ear: Secondary | ICD-10-CM | POA: Diagnosis not present

## 2017-06-17 DIAGNOSIS — Z6837 Body mass index (BMI) 37.0-37.9, adult: Secondary | ICD-10-CM | POA: Diagnosis not present

## 2017-06-17 DIAGNOSIS — J019 Acute sinusitis, unspecified: Secondary | ICD-10-CM | POA: Diagnosis not present

## 2017-09-04 DIAGNOSIS — Z9289 Personal history of other medical treatment: Secondary | ICD-10-CM | POA: Diagnosis not present

## 2017-09-04 DIAGNOSIS — J00 Acute nasopharyngitis [common cold]: Secondary | ICD-10-CM | POA: Diagnosis not present

## 2017-09-04 DIAGNOSIS — J029 Acute pharyngitis, unspecified: Secondary | ICD-10-CM | POA: Diagnosis not present

## 2017-12-24 DIAGNOSIS — Z6838 Body mass index (BMI) 38.0-38.9, adult: Secondary | ICD-10-CM | POA: Diagnosis not present

## 2017-12-24 DIAGNOSIS — H6693 Otitis media, unspecified, bilateral: Secondary | ICD-10-CM | POA: Diagnosis not present

## 2017-12-24 DIAGNOSIS — B373 Candidiasis of vulva and vagina: Secondary | ICD-10-CM | POA: Diagnosis not present

## 2017-12-24 DIAGNOSIS — R070 Pain in throat: Secondary | ICD-10-CM | POA: Diagnosis not present

## 2018-04-01 ENCOUNTER — Encounter: Payer: Self-pay | Admitting: Family Medicine

## 2018-04-01 ENCOUNTER — Ambulatory Visit: Payer: BLUE CROSS/BLUE SHIELD | Admitting: Family Medicine

## 2018-04-01 VITALS — BP 124/82 | HR 100 | Temp 98.9°F | Resp 12 | Ht 67.5 in | Wt 260.4 lb

## 2018-04-01 DIAGNOSIS — F419 Anxiety disorder, unspecified: Secondary | ICD-10-CM | POA: Insufficient documentation

## 2018-04-01 DIAGNOSIS — F320 Major depressive disorder, single episode, mild: Secondary | ICD-10-CM | POA: Diagnosis not present

## 2018-04-01 MED ORDER — SERTRALINE HCL 50 MG PO TABS: 50.0000 mg | ORAL_TABLET | Freq: Every day | ORAL | 2 refills | Status: DC

## 2018-04-01 NOTE — Assessment & Plan Note (Signed)
New onset. Strongly recommend psychotherapy/counseling. We discussed some side effects of sertraline, instructed to start 25 mg daily for 3 to 5 days, then she can increase to 50 mg. Instructed about warning signs. Follow-up in 4 weeks, before if needed.

## 2018-04-01 NOTE — Patient Instructions (Addendum)
A few things to remember from today's visit:   Depression, major, single episode, moderate (HCC) - Plan: sertraline (ZOLOFT) 50 MG tablet  Anxiety disorder, unspecified type - Plan: sertraline (ZOLOFT) 50 MG tablet  Today we started Sertraline, this type of medications can increase suicidal risk. This is more prevalent among children,adolecents, and young adults with major depression or other psychiatric disorders. It can also make depression worse. Most common side effects are gastrointestinal, self limited after a few weeks: diarrhea, nausea, constipation  Or diarrhea among some.  In general it is well tolerated. We will follow closely.  Start taking half tablet for 5 days, and then increase to to the whole tablet.    Please be sure medication list is accurate. If a new problem present, please set up appointment sooner than planned today.

## 2018-04-01 NOTE — Progress Notes (Signed)
ACUTE VISIT   HPI:  Chief Complaint  Patient presents with  . Anxiety  . Depression    Shelly [name] is a 37 y.o. female, who is here today complaining of worsening anxiety and depressed mood.   She has history of anxiety, which she attributes to stress at work. Symptoms have been worse for the past few months, she is having crying spells. Feeling overwhelmed, sleeping more than usual, feeling "empty." She attributes symptoms to the death of her grandmother in 2017/08/18.She is not coping well with her loss. In the past she has tried Xanax as needed for acute anxiety.  She denies suicidal thoughts. She is living with her one year old foster daughter, who she has since she was 32 days old and planning to adopt once legal issues are resolved. Family lives in [place].  Depression screen [facility] 2/9 04/01/2018  Decreased Interest 2  Down, Depressed, Hopeless 1  PHQ - 2 Score 3  Altered sleeping 2  Tired, decreased energy 3  Change in appetite 1  Feeling bad or failure about yourself  0  Trouble concentrating 3  Moving slowly or fidgety/restless 0  Suicidal thoughts 0  PHQ-9 Score 12  Difficult doing work/chores Very difficult    Review of Systems  Constitutional: Positive for activity change, appetite change and fatigue.  Respiratory: Negative for shortness of breath and wheezing.   Cardiovascular: Negative for chest pain and palpitations.  Endocrine: Negative for cold intolerance and heat intolerance.  Neurological: Negative for syncope and headaches.  Psychiatric/Behavioral: Positive for sleep disturbance. Negative for confusion, hallucinations and suicidal ideas. The patient is nervous/anxious.       Current Outpatient Medications on File Prior to Visit  Medication Sig Dispense Refill  . Carboxymethylcellul-Glycerin (LUBRICATING EYE DROPS OP) Apply to eye.    Marland Kitchen ketotifen (ZADITOR) 0.025 % ophthalmic solution Place 1 drop into both eyes 2 (two) times daily.    .  mometasone (NASONEX) 50 MCG/ACT nasal spray Place 2 sprays into the nose daily. 17 g 3   No current facility-administered medications on file prior to visit.      Past Medical History:  Diagnosis Date  . Allergy   . Depression    history of   . Eye strain    patient has digitial eye strain that is being address at [facility]  . History of kidney stones 2008   Allergies  Allergen Reactions  . Other     Social History   Socioeconomic History  . Marital status: Single    Spouse name: Not on file  . Number of children: Not on file  . Years of education: Not on file  . Highest education level: Not on file  Occupational History  . Not on file  Social Needs  . Financial resource strain: Not on file  . Food insecurity:    Worry: Not on file    Inability: Not on file  . Transportation needs:    Medical: Not on file    Non-medical: Not on file  Tobacco Use  . Smoking status: Never Smoker  . Smokeless tobacco: Never Used  Substance and Sexual Activity  . Alcohol use: Yes    Alcohol/week: 0.0 standard drinks    Comment: occasional  . Drug use: No  . Sexual activity: Never  Lifestyle  . Physical activity:    Days per week: Not on file    Minutes per session: Not on file  . Stress: Not on  file  Relationships  . Social connections:    Talks on phone: Not on file    Gets together: Not on file    Attends religious service: Not on file    Active member of club or organization: Not on file    Attends meetings of clubs or organizations: Not on file    Relationship status: Not on file  Other Topics Concern  . Not on file  Social History Narrative  . Not on file    Vitals:   04/01/18 1133  BP: 124/82  Pulse: 100  Resp: 12  Temp: 98.9 F (37.2 C)  SpO2: 100%   Body mass index is 40.18 kg/m.   Physical Exam  Nursing note and vitals reviewed. Constitutional: She is oriented to person, place, and time. She appears well-developed. No distress.  HENT:    Head: Normocephalic.  Mouth/Throat: Oropharynx is clear and moist and mucous membranes are normal.  Eyes: Pupils are equal, round, and reactive to light. Conjunctivae are normal.  Cardiovascular: Normal rate and regular rhythm.  No murmur heard. Respiratory: Effort normal and breath sounds normal. No respiratory distress.  Neurological: She is alert and oriented to person, place, and time. She has normal strength. Gait normal.  Skin: Skin is warm. No erythema.  Psychiatric: Her mood appears anxious. Her affect is labile. Cognition and memory are normal. She exhibits a depressed mood. She expresses no suicidal ideation. She expresses no suicidal plans.  Well-groomed, poor eye contact.      ASSESSMENT AND PLAN:   Ms. Calvina was seen today for anxiety and depression.  Diagnoses and all orders for this visit:  Anxiety disorder Getting worse. After discussion of few pharmacologic options, she agrees with trying sertraline. Instructed about warning signs. Follow-up in 4 weeks, before if needed.  Mild major depression, single episode (HCC) New onset. Strongly recommend psychotherapy/counseling. We discussed some side effects of sertraline, instructed to start 25 mg daily for 3 to 5 days, then she can increase to 50 mg. Instructed about warning signs. Follow-up in 4 weeks, before if needed.       Return in about 4 weeks (around 04/29/2018) for depression.     Betty G. [place], MD  [facility]. [place] office.

## 2018-04-01 NOTE — Assessment & Plan Note (Signed)
Getting worse. After discussion of few pharmacologic options, she agrees with trying sertraline. Instructed about warning signs. Follow-up in 4 weeks, before if needed.

## 2018-04-16 DIAGNOSIS — Z23 Encounter for immunization: Secondary | ICD-10-CM | POA: Diagnosis not present

## 2018-05-01 ENCOUNTER — Ambulatory Visit: Payer: BLUE CROSS/BLUE SHIELD | Admitting: Family Medicine

## 2018-05-26 ENCOUNTER — Ambulatory Visit: Payer: BLUE CROSS/BLUE SHIELD | Admitting: Family Medicine

## 2018-05-26 ENCOUNTER — Encounter: Payer: Self-pay | Admitting: *Deleted

## 2018-05-26 ENCOUNTER — Encounter: Payer: Self-pay | Admitting: Family Medicine

## 2018-05-26 VITALS — BP 124/80 | HR 100 | Temp 99.1°F | Resp 12 | Ht 67.5 in | Wt 261.0 lb

## 2018-05-26 DIAGNOSIS — F419 Anxiety disorder, unspecified: Secondary | ICD-10-CM

## 2018-05-26 DIAGNOSIS — F320 Major depressive disorder, single episode, mild: Secondary | ICD-10-CM | POA: Diagnosis not present

## 2018-05-26 MED ORDER — SERTRALINE HCL 25 MG PO TABS: 25.0000 mg | ORAL_TABLET | Freq: Every day | ORAL | 1 refills | Status: DC

## 2018-05-26 NOTE — Assessment & Plan Note (Signed)
PHQ score improved, 5 (12). He had in the office she has some crying spells. Psychotherapy recommended. Instructed about warning signs. No changes in sertraline 25 mg. Follow-up in 3 months, before if needed.

## 2018-05-26 NOTE — Assessment & Plan Note (Signed)
She is reporting improvement. No changes in sertraline 25 mg. Information given about psychotherapy service, encouraged to arrange appointment. Follow-up in 3 months, before if needed.

## 2018-05-26 NOTE — Progress Notes (Signed)
HPI:   [name] is a 37 y.o. female, who is here today to follow on recent OV.  She was last seen 04/01/2017 due to worsening anxiety and depression. Sertraline 50 mg was started.  She felt "jettery" when she tried to increase dose from 25 mg to 50 mg,so she continues 25 mg. She has noted improvement in most symptoms. Still having episodes of crying spells,mainly around this time. Tomorrow will be the birthday of her grandmother, who died in 08-16-17.She is requesting  Time off from work, stays that she "cannot work like this."   She denies suicidal thoughts.  Motivation has improved as well as sleep. She is performing better, not longer having problems with concentration.   Depression screen [facility] 2/9 05/26/2018 04/01/2018 05/26/2015  Decreased Interest 1 2 0  Down, Depressed, Hopeless 1 1 0  PHQ - 2 Score 2 3 0  Altered sleeping 1 2 -  Tired, decreased energy 0 3 -  Change in appetite 0 1 -  Feeling bad or failure about yourself  2 0 -  Trouble concentrating 0 3 -  Moving slowly or fidgety/restless 0 0 -  Suicidal thoughts 0 0 -  PHQ-9 Score 5 12 -  Difficult doing work/chores Not difficult at all Very difficult -     Review of Systems  Constitutional: Positive for fatigue. Negative for activity change and appetite change.  Respiratory: Negative for chest tightness, shortness of breath and wheezing.   Gastrointestinal: Negative for abdominal pain, diarrhea, nausea and vomiting.  Skin: Negative for rash.  Neurological: Negative for tremors and headaches.  Psychiatric/Behavioral: Negative for confusion, hallucinations and suicidal ideas. The patient is nervous/anxious.       Current Outpatient Medications on File Prior to Visit  Medication Sig Dispense Refill  . Carboxymethylcellul-Glycerin (LUBRICATING EYE DROPS OP) Apply to eye.    Marland Kitchen ketotifen (ZADITOR) 0.025 % ophthalmic solution Place 1 drop into both eyes 2 (two) times daily.    . mometasone (NASONEX)  50 MCG/ACT nasal spray Place 2 sprays into the nose daily. 17 g 3   No current facility-administered medications on file prior to visit.      Past Medical History:  Diagnosis Date  . Allergy   . Depression    history of   . Eye strain    patient has digitial eye strain that is being address at [facility] [place]  . History of kidney stones 2008   Allergies  Allergen Reactions  . Other     Social History   Socioeconomic History  . Marital status: Single    Spouse name: Not on file  . Number of children: Not on file  . Years of education: Not on file  . Highest education level: Not on file  Occupational History  . Not on file  Social Needs  . Financial resource strain: Not on file  . Food insecurity:    Worry: Not on file    Inability: Not on file  . Transportation needs:    Medical: Not on file    Non-medical: Not on file  Tobacco Use  . Smoking status: Never Smoker  . Smokeless tobacco: Never Used  Substance and Sexual Activity  . Alcohol use: Yes    Alcohol/week: 0.0 standard drinks    Comment: occasional  . Drug use: No  . Sexual activity: Never  Lifestyle  . Physical activity:    Days per week: Not on file    Minutes per  session: Not on file  . Stress: Not on file  Relationships  . Social connections:    Talks on phone: Not on file    Gets together: Not on file    Attends religious service: Not on file    Active member of club or organization: Not on file    Attends meetings of clubs or organizations: Not on file    Relationship status: Not on file  Other Topics Concern  . Not on file  Social History Narrative  . Not on file    Vitals:   05/26/18 1405  BP: 124/80  Pulse: 100  Resp: 12  Temp: 99.1 F (37.3 C)  SpO2: 96%   Body mass index is 40.28 kg/m.   Physical Exam  Nursing note and vitals reviewed. Constitutional: She is oriented to person, place, and time. She appears well-developed. No distress.  HENT:  Head: Normocephalic.    Mouth/Throat: Oropharynx is clear and moist and mucous membranes are normal.  Eyes: Pupils are equal, round, and reactive to light. Conjunctivae are normal.  Cardiovascular: Normal rate and regular rhythm.  No murmur heard. Respiratory: Effort normal and breath sounds normal. No respiratory distress.  Musculoskeletal: She exhibits no edema.  Neurological: She is alert and oriented to person, place, and time. She has normal strength. Gait normal.  Skin: Skin is warm. No erythema.  Psychiatric: Her affect is labile. Cognition and memory are normal. She expresses no suicidal ideation. She expresses no suicidal plans.  Well groomed,poor eye contact.    ASSESSMENT AND PLAN:  Ms. Shelly [name] was seen today for follow-up.  Diagnoses and all orders for this visit:   Anxiety disorder She is reporting improvement. No changes in sertraline 25 mg. Information given about psychotherapy service, encouraged to arrange appointment. Follow-up in 3 months, before if needed.  Mild major depression, single episode (HCC) PHQ score improved, 5 (12). He had in the office she has some crying spells. Psychotherapy recommended. Instructed about warning signs. No changes in sertraline 25 mg. Follow-up in 3 months, before if needed.   Note excuse given to return Monday 06/01/18.   Shelly [name] G. [place], MD  [facility]. [place] office.

## 2018-05-26 NOTE — Patient Instructions (Signed)
A few things to remember from today's visit:   Mild major depression, single episode (HCC) - Plan: sertraline (ZOLOFT) 25 MG tablet  Anxiety disorder, unspecified type - Plan: sertraline (ZOLOFT) 25 MG tablet  Continue sertraline 25 mg. Strongly recommend psychotherapy treatment. I will see you back in 3 months, before if needed.  Please be sure medication list is accurate. If a new problem present, please set up appointment sooner than planned today.

## 2018-07-24 DIAGNOSIS — Z114 Encounter for screening for human immunodeficiency virus [HIV]: Secondary | ICD-10-CM | POA: Diagnosis not present

## 2018-07-24 DIAGNOSIS — Z30017 Encounter for initial prescription of implantable subdermal contraceptive: Secondary | ICD-10-CM | POA: Diagnosis not present

## 2018-07-24 DIAGNOSIS — Z3046 Encounter for surveillance of implantable subdermal contraceptive: Secondary | ICD-10-CM | POA: Diagnosis not present

## 2018-09-28 DIAGNOSIS — Z6838 Body mass index (BMI) 38.0-38.9, adult: Secondary | ICD-10-CM | POA: Diagnosis not present

## 2018-09-28 DIAGNOSIS — R509 Fever, unspecified: Secondary | ICD-10-CM | POA: Diagnosis not present

## 2018-09-28 DIAGNOSIS — B349 Viral infection, unspecified: Secondary | ICD-10-CM | POA: Diagnosis not present

## 2018-09-28 DIAGNOSIS — R05 Cough: Secondary | ICD-10-CM | POA: Diagnosis not present

## 2019-09-03 DIAGNOSIS — Z1152 Encounter for screening for COVID-19: Secondary | ICD-10-CM | POA: Diagnosis not present

## 2019-11-17 DIAGNOSIS — J301 Allergic rhinitis due to pollen: Secondary | ICD-10-CM | POA: Diagnosis not present

## 2019-12-13 DIAGNOSIS — Z3046 Encounter for surveillance of implantable subdermal contraceptive: Secondary | ICD-10-CM | POA: Diagnosis not present

## 2020-03-07 DIAGNOSIS — H10219 Acute toxic conjunctivitis, unspecified eye: Secondary | ICD-10-CM | POA: Diagnosis not present

## 2020-04-25 DIAGNOSIS — Z20828 Contact with and (suspected) exposure to other viral communicable diseases: Secondary | ICD-10-CM | POA: Diagnosis not present

## 2020-09-04 ENCOUNTER — Encounter: Payer: Self-pay | Admitting: Family Medicine

## 2020-09-04 ENCOUNTER — Ambulatory Visit (INDEPENDENT_AMBULATORY_CARE_PROVIDER_SITE_OTHER): Payer: BC Managed Care – PPO | Admitting: Family Medicine

## 2020-09-04 ENCOUNTER — Other Ambulatory Visit: Payer: Self-pay

## 2020-09-04 VITALS — BP 130/70 | HR 98 | Resp 16 | Ht 67.5 in | Wt 272.0 lb

## 2020-09-04 DIAGNOSIS — E785 Hyperlipidemia, unspecified: Secondary | ICD-10-CM

## 2020-09-04 DIAGNOSIS — Z130 Encounter for screening for diseases of the blood and blood-forming organs and certain disorders involving the immune mechanism: Secondary | ICD-10-CM | POA: Diagnosis not present

## 2020-09-04 DIAGNOSIS — Z1329 Encounter for screening for other suspected endocrine disorder: Secondary | ICD-10-CM

## 2020-09-04 DIAGNOSIS — I499 Cardiac arrhythmia, unspecified: Secondary | ICD-10-CM | POA: Diagnosis not present

## 2020-09-04 DIAGNOSIS — E049 Nontoxic goiter, unspecified: Secondary | ICD-10-CM

## 2020-09-04 DIAGNOSIS — F419 Anxiety disorder, unspecified: Secondary | ICD-10-CM

## 2020-09-04 DIAGNOSIS — Z1159 Encounter for screening for other viral diseases: Secondary | ICD-10-CM

## 2020-09-04 DIAGNOSIS — E040 Nontoxic diffuse goiter: Secondary | ICD-10-CM

## 2020-09-04 DIAGNOSIS — Z1231 Encounter for screening mammogram for malignant neoplasm of breast: Secondary | ICD-10-CM

## 2020-09-04 DIAGNOSIS — D509 Iron deficiency anemia, unspecified: Secondary | ICD-10-CM

## 2020-09-04 DIAGNOSIS — Z13228 Encounter for screening for other metabolic disorders: Secondary | ICD-10-CM | POA: Diagnosis not present

## 2020-09-04 DIAGNOSIS — Z0000 Encounter for general adult medical examination without abnormal findings: Secondary | ICD-10-CM

## 2020-09-04 LAB — BASIC METABOLIC PANEL
BUN: 12 mg/dL (ref 6–23)
CO2: 24 mEq/L (ref 19–32)
Calcium: 9.1 mg/dL (ref 8.4–10.5)
Chloride: 107 mEq/L (ref 96–112)
Creatinine, Ser: 0.79 mg/dL (ref 0.40–1.20)
GFR: 92.74 mL/min (ref >60.00)
Glucose, Bld: 78 mg/dL (ref 70–99)
Potassium: 3.9 mEq/L (ref 3.5–5.1)
Sodium: 140 mEq/L (ref 135–145)

## 2020-09-04 LAB — CBC
HCT: 35.3 % — ABNORMAL LOW (ref 36.0–46.0)
Hemoglobin: 11.5 g/dL — ABNORMAL LOW (ref 12.0–15.0)
MCHC: 32.4 g/dL (ref 30.0–36.0)
MCV: 70.1 fl — ABNORMAL LOW (ref 78.0–100.0)
Platelets: 261 10*3/uL (ref 150.0–400.0)
RBC: 5.04 Mil/uL (ref 3.87–5.11)
RDW: 18.8 % — ABNORMAL HIGH (ref 11.5–15.5)
WBC: 4.8 10*3/uL (ref 4.0–10.5)

## 2020-09-04 LAB — LIPID PANEL
Cholesterol: 234 mg/dL — ABNORMAL HIGH (ref 0–200)
HDL: 60.2 mg/dL (ref >39.00)
LDL Cholesterol: 160 mg/dL — ABNORMAL HIGH (ref 0–99)
NonHDL: 173.81
Total CHOL/HDL Ratio: 4
Triglycerides: 69 mg/dL (ref 0.0–149.0)
VLDL: 13.8 mg/dL (ref 0.0–40.0)

## 2020-09-04 LAB — TSH: TSH: 0.71 u[IU]/mL (ref 0.35–4.50)

## 2020-09-04 LAB — HEMOGLOBIN A1C: Hgb A1c MFr Bld: 5.8 % (ref 4.6–6.5)

## 2020-09-04 NOTE — Patient Instructions (Addendum)
Today you have you routine preventive visit. A few things to remember from today's visit:   Routine general medical examination at a health care facility  Encounter for HCV screening test for low risk patient - Plan: Hepatitis C antibody screen  Hyperlipidemia, unspecified hyperlipidemia type - Plan: Lipid panel  Goiter diffuse - Plan: TSH  Irregular heart rhythm - Plan: EKG 12-Lead, CBC, Ambulatory referral to Cardiology  Visit for screening mammogram - Plan: Mammogram Digital Screening  Screening for endocrine, metabolic and immunity disorder - Plan: Basic metabolic panel, Hemoglobin A1c  Please be sure medication list is accurate. If a new problem present, please set up appointment sooner than planned today.  At least 150 minutes of moderate exercise per week, daily brisk walking for 15-30 min is a good exercise option. Healthy diet low in saturated (animal) fats and sweets and consisting of fresh fruits and vegetables, lean meats such as fish and white chicken and whole grains.  These are some of recommendations for screening depending of age and risk factors:  - Vaccines:  Tdap vaccine every 10 years.  Shingles vaccine recommended at age 13, could be given after 42 years of age but not sure about insurance coverage.   Pneumonia vaccines: Pneumovax at 65. Sometimes Pneumovax is giving earlier if history of smoking, lung disease,diabetes,kidney disease among some.  Screening for diabetes at age 31 and every 3 years.  Cervical cancer prevention:  Pap smear starts at 42 years of age and continues periodically until 42 years old in low risk women. Pap smear every 3 years between 49 and 67 years old. Pap smear every 3-5 years between women 30 and older if pap smear negative and HPV screening negative.  -Breast cancer: Mammogram: There is disagreement between experts about when to start screening in low risk asymptomatic female but recent recommendations are to start screening  at 28 and not later than 42 years old , every 1-2 years and after 42 yo q 2 years. Screening is recommended until 42 years old but some women can continue screening depending of healthy issues.  Colon cancer screening: Has been recently changed to 42 yo. Insurance may not cover until you are 42 years old. Screening is recommended until 42 years old.  Cholesterol disorder screening at age 37 and every 3 years. N/A  Also recommended:  1. Dental visit- Brush and floss your teeth twice daily; visit your dentist twice a year. 2. Eye doctor- Get an eye exam at least every 2 years. 3. Helmet use- Always wear a helmet when riding a bicycle, motorcycle, rollerblading or skateboarding. 4. Safe sex- If you may be exposed to sexually transmitted infections, use a condom. 5. Seat belts- Seat belts can save your live; always wear one. 6. Smoke/Carbon Monoxide detectors- These detectors need to be installed on the appropriate level of your home. Replace batteries at least once a year. 7. Skin cancer- When out in the sun please cover up and use sunscreen 15 SPF or higher. 8. Violence- If anyone is threatening or hurting you, please tell your healthcare provider.  9. Drink alcohol in moderation- Limit alcohol intake to one drink or less per day. Never drink and drive. 10. Calcium supplementation 1000 to 1200 mg daily, ideally through your diet.  Vitamin D supplementation 800 units daily.

## 2020-09-04 NOTE — Progress Notes (Signed)
HPI: Shelly [name] is a 40 y.o. female, who is here today for her routine physical.  Last CPE: 2019, gyn preventive care at [facility].  Regular exercise 3 or more time per week: She is not doing so regularly. Following a healthy diet: She eats out all the time. She lives with her 63 years old daughter.  Chronic medical problems: HLD, hx of anemia,and hx of depression/anxiety.  Pap smear:2019 negative, per pt report. Hx of abnormal pap smears: Negative Hx of STD's: Negative.  Immunization History  Administered Date(s) Administered  . Influenza,inj,Quad PF,6+ Mos 05/30/2016  . Tdap 05/30/2011   Mammogram: Never. Colonoscopy: N/A DEXA: N/A Hep C screening: Never  HLD: She is on non pharmacologic treatment. Lab Results  Component Value Date   CHOL 223 (H) 05/30/2016   HDL 65.70 05/30/2016   LDLCALC 145 (H) 05/30/2016   TRIG 57.0 05/30/2016   CHOLHDL 3 05/30/2016   Lab Results  Component Value Date   HGBA1C 5.6 05/30/2016   Today noted irregular HR on examination. Remembers one episode of "a little something" in her chest when she was in bed.  Occasionally she feels "winded" with moderate physical activity but attributed to lack of exercise. No associated CP or diaphoresis.  Hx of iron def anemia: She is not on iron supplementation. Heavy menses.  Lab Results  Component Value Date   WBC 6.3 05/30/2016   HGB 13.1 05/30/2016   HCT 41.1 05/30/2016   MCV 75.0 (L) 05/30/2016   PLT 290.0 05/30/2016   She is also c/o episodes of crying spells, feeling upset. Problem has been going on for 3-4 months. She has not identified exacerbating or alleviating factors. Hx of depression, she does not feel like this is caused depressed mood. She was on Sertraline in 2019.  She wonders if it is caused by hormonal changes. It is worse around menstrual periods. She still has regular menses. Sleeping about 7 hours.  Depression screen [facility] 2/9 09/07/2020 05/26/2018  04/01/2018 05/26/2015 04/19/2015  Decreased Interest 0 1 2 0 0  Down, Depressed, Hopeless 0 1 1 0 0  PHQ - 2 Score 0 2 3 0 0  Altered sleeping 0 1 2 - -  Tired, decreased energy 1 0 3 - -  Change in appetite 1 0 1 - -  Feeling bad or failure about yourself  0 2 0 - -  Trouble concentrating 0 0 3 - -  Moving slowly or fidgety/restless 0 0 0 - -  Suicidal thoughts 0 0 0 - -  PHQ-9 Score 2 5 12  - -  Difficult doing work/chores Somewhat difficult Not difficult at all Very difficult - -    Review of Systems  Constitutional: Negative for appetite change, fatigue and fever.  HENT: Negative for dental problem, hearing loss, mouth sores and sore throat.   Eyes: Negative for redness and visual disturbance.  Respiratory: Negative for cough and wheezing.   Cardiovascular: Negative for chest pain and leg swelling.  Gastrointestinal: Negative for abdominal pain, nausea and vomiting.       No changes in bowel habits.  Endocrine: Negative for cold intolerance, heat intolerance, polydipsia, polyphagia and polyuria.  Genitourinary: Negative for decreased urine volume, dysuria, hematuria, vaginal bleeding and vaginal discharge.  Musculoskeletal: Negative for gait problem and myalgias.  Skin: Negative for color change and rash.  Allergic/Immunologic: Positive for environmental allergies.  Neurological: Negative for syncope, weakness and headaches.  Hematological: Negative for adenopathy. Does not bruise/bleed easily.  Psychiatric/Behavioral: Negative for confusion and hallucinations.  All other systems reviewed and are negative.  No current outpatient medications on file prior to visit.   No current facility-administered medications on file prior to visit.   Past Medical History:  Diagnosis Date  . Allergy   . Depression    history of   . Eye strain    patient has digitial eye strain that is being address at [facility]  . History of kidney stones 2008    Past Surgical History:   Procedure Laterality Date  . WISDOM TOOTH EXTRACTION  2011    Allergies  Allergen Reactions  . Other    Family History  Problem Relation Age of Onset  . Hypertension Mother   . Arthritis Maternal Grandmother   . Asthma Maternal Grandmother   . Diabetes Maternal Grandmother   . Hypertension Maternal Grandmother   . Arthritis Maternal Grandfather   . Hypertension Maternal Grandfather   . Arthritis Paternal Grandmother   . Hypertension Paternal Grandmother   . Hyperlipidemia Paternal Grandmother   . Heart attack Paternal Grandmother        06/2014; received 5 stents    Social History   Socioeconomic History  . Marital status: Single    Spouse name: Not on file  . Number of children: Not on file  . Years of education: Not on file  . Highest education level: Not on file  Occupational History  . Not on file  Tobacco Use  . Smoking status: Never Smoker  . Smokeless tobacco: Never Used  Substance and Sexual Activity  . Alcohol use: Yes    Alcohol/week: 0.0 standard drinks    Comment: occasional  . Drug use: No  . Sexual activity: Never  Other Topics Concern  . Not on file  Social History Narrative  . Not on file   Social Determinants of Health   Financial Resource Strain: Not on file  Food Insecurity: Not on file  Transportation Needs: Not on file  Physical Activity: Not on file  Stress: Not on file  Social Connections: Not on file   Vitals:   09/04/20 0824  BP: 130/70  Pulse: 98  Resp: 16  SpO2: 97%   Body mass index is 41.97 kg/m.  Wt Readings from Last 3 Encounters:  09/04/20 272 lb (123.4 kg)  05/26/18 261 lb (118.4 kg)  04/01/18 260 lb 6 oz (118.1 kg)   Physical Exam Vitals and nursing note reviewed.  Constitutional:      General: She is not in acute distress.    Appearance: She is well-developed.  HENT:     Head: Normocephalic and atraumatic.     Right Ear: Hearing, tympanic membrane, ear canal and external ear normal.     Left Ear:  Hearing, tympanic membrane, ear canal and external ear normal.     Mouth/Throat:     Mouth: Oropharynx is clear and moist and mucous membranes are normal. Mucous membranes are moist.     Pharynx: Oropharynx is clear. Uvula midline.  Eyes:     Extraocular Movements: Extraocular movements intact and EOM normal.     Conjunctiva/sclera: Conjunctivae normal.     Pupils: Pupils are equal, round, and reactive to light.  Neck:     Thyroid: Thyromegaly present.     Trachea: No tracheal deviation.  Cardiovascular:     Rate and Rhythm: Normal rate. Rhythm irregular.     Pulses:          Dorsalis pedis pulses  are 2+ on the right side and 2+ on the left side.     Heart sounds: No murmur heard.   Pulmonary:     Effort: Pulmonary effort is normal. No respiratory distress.     Breath sounds: Normal breath sounds.  Chest:  Breasts:     Right: No axillary adenopathy or supraclavicular adenopathy.     Left: No axillary adenopathy or supraclavicular adenopathy.    Abdominal:     Palpations: Abdomen is soft. There is no hepatomegaly or mass.     Tenderness: There is no abdominal tenderness.  Genitourinary:    Comments: Breast: No masses,skin changes,or nipple discharge bilateral. Musculoskeletal:        General: No edema.     Comments: No major deformity or signs of synovitis appreciated.  Lymphadenopathy:     Cervical: No cervical adenopathy.     Upper Body:     Right upper body: No supraclavicular or axillary adenopathy.     Left upper body: No supraclavicular or axillary adenopathy.  Skin:    General: Skin is warm.     Findings: No erythema or rash.  Neurological:     General: No focal deficit present.     Mental Status: She is alert and oriented to person, place, and time.     Cranial Nerves: No cranial nerve deficit.     Coordination: Coordination normal.     Gait: Gait normal.     Deep Tendon Reflexes: Strength normal.     Reflex Scores:      Bicep reflexes are 2+ on the right  side and 2+ on the left side.      Patellar reflexes are 2+ on the right side and 2+ on the left side. Psychiatric:        Mood and Affect: Mood and affect normal.        Speech: Speech normal.     Comments: Well groomed, good eye contact.   ASSESSMENT AND PLAN:  Shelly [name] was here today annual physical examination.  Orders Placed This Encounter  Procedures  . Mammogram Digital Screening  . Basic metabolic panel  . Hemoglobin A1c  . Lipid panel  . TSH  . CBC  . Hep C Antibody  . Ambulatory referral to Cardiology  . EKG 12-Lead    Lab Results  Component Value Date   WBC 4.8 09/04/2020   HGB 11.5 (L) 09/04/2020   HCT 35.3 (L) 09/04/2020   MCV 70.1 (L) 09/04/2020   PLT 261.0 09/04/2020   Lab Results  Component Value Date   TSH 0.71 09/04/2020   Lab Results  Component Value Date   CREATININE 0.79 09/04/2020   BUN 12 09/04/2020   NA 140 09/04/2020   K 3.9 09/04/2020   CL 107 09/04/2020   CO2 24 09/04/2020   Lab Results  Component Value Date   HGBA1C 5.8 09/04/2020   Lab Results  Component Value Date   CHOL 234 (H) 09/04/2020   HDL 60.20 09/04/2020   LDLCALC 160 (H) 09/04/2020   TRIG 69.0 09/04/2020   CHOLHDL 4 09/04/2020   Routine general medical examination at a health care facility We discussed the importance of regular physical activity and healthy diet for prevention of chronic illness and/or complications. Preventive guidelines reviewed. Vaccination up to date. Will try to obtain copy of last pap smear. Next CPE in a year.  The 10-year ASCVD risk score Denman George DC Jr., et al., 2013) is: 0.7%   Values used  to calculate the score:     Age: 28 years     Sex: Female     Is Non-Hispanic African American: Yes     Diabetic: No     Tobacco smoker: No     Systolic Blood Pressure: 130 mmHg     Is BP treated: No     HDL Cholesterol: 60.2 mg/dL     Total Cholesterol: 234 mg/dL  Encounter for HCV screening test for low risk patient -      Hepatitis C antibody screen; Future  Hyperlipidemia, unspecified hyperlipidemia type Low fat diet recommended for now. Further recommendations according to FLP results.  Goiter diffuse Stable. Further recommendations according to TSH.  Irregular heart rhythm We discussed possible etiologies. EKG today: SR,normal axis,PVC'and T wave abnormalities. No other EKG for comparison. Some SOB with exertion and noted irregular HR when in bed a few days ago, cardiology referral placed.  Iron deficiency anemia, unspecified iron deficiency anemia type Further recommendations according to CBC results.  Visit for screening mammogram -     Mammogram Digital Screening; Future  Screening for endocrine, metabolic and immunity disorder -     Basic metabolic panel; Future -     Hemoglobin A1c; Future  Anxiety disorder, unspecified type Mood changes could be caused by anxiety. ?Prementrual synd. PHQ score 2. For now she prefers to hold on pharmacologic treatment. CBT may help.   Return in about 1 year (around 09/04/2021) for CPE.  Shelly Ehinger G. [place], MD  [facility]. [place] office.   Today you have you routine preventive visit. A few things to remember from today's visit:   Routine general medical examination at a health care facility  Encounter for HCV screening test for low risk patient - Plan: Hepatitis C antibody screen  Hyperlipidemia, unspecified hyperlipidemia type - Plan: Lipid panel  Goiter diffuse - Plan: TSH  Irregular heart rhythm - Plan: EKG 12-Lead, CBC  Visit for screening mammogram - Plan: Mammogram Digital Screening  Screening for endocrine, metabolic and immunity disorder - Plan: Basic metabolic panel, Hemoglobin A1c  Please be sure medication list is accurate. If a new problem present, please set up appointment sooner than planned today.  At least 150 minutes of moderate exercise per week, daily brisk walking for 15-30 min is a good exercise  option. Healthy diet low in saturated (animal) fats and sweets and consisting of fresh fruits and vegetables, lean meats such as fish and white chicken and whole grains.  These are some of recommendations for screening depending of age and risk factors:  - Vaccines:  Tdap vaccine every 10 years.  Shingles vaccine recommended at age 23, could be given after 42 years of age but not sure about insurance coverage.   Pneumonia vaccines: Pneumovax at 65. Sometimes Pneumovax is giving earlier if history of smoking, lung disease,diabetes,kidney disease among some.  Screening for diabetes at age 25 and every 3 years.  Cervical cancer prevention:  Pap smear starts at 42 years of age and continues periodically until 42 years old in low risk women. Pap smear every 3 years between 78 and 63 years old. Pap smear every 3-5 years between women 30 and older if pap smear negative and HPV screening negative.  -Breast cancer: Mammogram: There is disagreement between experts about when to start screening in low risk asymptomatic female but recent recommendations are to start screening at 20 and not later than 42 years old , every 1-2 years and after 42 yo q 2 years.  Screening is recommended until 42 years old but some women can continue screening depending of healthy issues.  Colon cancer screening: Has been recently changed to 42 yo. Insurance may not cover until you are 42 years old. Screening is recommended until 42 years old.  Cholesterol disorder screening at age 42 and every 3 years. N/A  Also recommended:  1. Dental visit- Brush and floss your teeth twice daily; visit your dentist twice a year. 2. Eye doctor- Get an eye exam at least every 2 years. 3. Helmet use- Always wear a helmet when riding a bicycle, motorcycle, rollerblading or skateboarding. 4. Safe sex- If you may be exposed to sexually transmitted infections, use a condom. 5. Seat belts- Seat belts can save your live; always wear  one. 6. Smoke/Carbon Monoxide detectors- These detectors need to be installed on the appropriate level of your home. Replace batteries at least once a year. 7. Skin cancer- When out in the sun please cover up and use sunscreen 15 SPF or higher. 8. Violence- If anyone is threatening or hurting you, please tell your healthcare provider.  9. Drink alcohol in moderation- Limit alcohol intake to one drink or less per day. Never drink and drive. 10. Calcium supplementation 1000 to 1200 mg daily, ideally through your diet.  Vitamin D supplementation 800 units daily.

## 2020-09-05 LAB — HEPATITIS C ANTIBODY
Hepatitis C Ab: NONREACTIVE
SIGNAL TO CUT-OFF: 0.02 (ref <1.00)

## 2020-09-07 ENCOUNTER — Encounter: Payer: Self-pay | Admitting: Family Medicine

## 2020-09-21 ENCOUNTER — Ambulatory Visit: Payer: BC Managed Care – PPO

## 2020-09-21 ENCOUNTER — Encounter: Payer: Self-pay | Admitting: Radiology

## 2020-09-21 ENCOUNTER — Ambulatory Visit: Payer: BC Managed Care – PPO | Admitting: Cardiology

## 2020-09-21 ENCOUNTER — Encounter: Payer: Self-pay | Admitting: Cardiology

## 2020-09-21 ENCOUNTER — Other Ambulatory Visit: Payer: Self-pay

## 2020-09-21 VITALS — BP 130/80 | HR 100 | Ht 67.5 in | Wt 272.0 lb

## 2020-09-21 DIAGNOSIS — R002 Palpitations: Secondary | ICD-10-CM | POA: Diagnosis not present

## 2020-09-21 DIAGNOSIS — I493 Ventricular premature depolarization: Secondary | ICD-10-CM

## 2020-09-21 NOTE — Progress Notes (Signed)
Enrolled patient for a 14 day Zio XT  monitor to be mailed to patients home  °

## 2020-09-21 NOTE — Patient Instructions (Signed)
Medication Instructions:  Your physician recommends that you continue on your current medications as directed. Please refer to the Current Medication list given to you today. *If you need a refill on your cardiac medications before your next appointment, please call your pharmacy*   Lab Work: none If you have labs (blood work) drawn today and your tests are completely normal, you will receive your results only by: Marland Kitchen MyChart Message (if you have MyChart) OR . A paper copy in the mail If you have any lab test that is abnormal or we need to change your treatment, we will call you to review the results.   Testing/Procedures: Your physician has requested that you have an echocardiogram. Echocardiography is a painless test that uses sound waves to create images of your heart. It provides your doctor with information about the size and shape of your heart and how well your heart's chambers and valves are working. This procedure takes approximately one hour. There are no restrictions for this procedure.   ZIO XT- Long Term Monitor Instructions   Your physician has requested you wear your ZIO patch monitor_______days.   This is a single patch monitor.  [employer] supplies one patch monitor per enrollment.  Additional stickers are not available.   Please do not apply patch if you will be having a Nuclear Stress Test, Echocardiogram, Cardiac CT, MRI, or Chest Xray during the time frame you would be wearing the monitor. The patch cannot be worn during these tests.  You cannot remove and re-apply the ZIO XT patch monitor.   Your ZIO patch monitor will be sent USPS Priority mail from [facility] directly to your home address. The monitor may also be mailed to a PO BOX if home delivery is not available.   It may take 3-5 days to receive your monitor after you have been enrolled.   Once you have received you monitor, please review enclosed instructions.  Your monitor has already been registered  assigning a specific monitor serial # to you.   Applying the monitor   Shave hair from upper left chest.   Hold abrader disc by orange tab.  Rub abrader in 40 strokes over left upper chest as indicated in your monitor instructions.   Clean area with 4 enclosed alcohol pads .  Use all pads to assure are is cleaned thoroughly.  Let dry.   Apply patch as indicated in monitor instructions.  Patch will be place under collarbone on left side of chest with arrow pointing upward.   Rub patch adhesive wings for 2 minutes.Remove white label marked "1".  Remove white label marked "2".  Rub patch adhesive wings for 2 additional minutes.   While looking in a mirror, press and release button in center of patch.  A small green light will flash 3-4 times .  This will be your only indicator the monitor has been turned on.     Do not shower for the first 24 hours.  You may shower after the first 24 hours.   Press button if you feel a symptom. You will hear a small click.  Record Date, Time and Symptom in the Patient Log Book.   When you are ready to remove patch, follow instructions on last 2 pages of Patient Log Book.  Stick patch monitor onto last page of Patient Log Book.   Place Patient Log Book in [place] box.  Use locking tab on box and tape box closed securely.  The [employer]  has prepaid postage on it.  Please place in mailbox as soon as possible.  Your physician should have your test results approximately 7 days after the monitor has been mailed back to [facility].   Call [facility] Customer Care at [phone] if you have questions regarding your ZIO XT patch monitor.  Call them immediately if you see an orange light blinking on your monitor.   If your monitor falls off in less than 4 days contact our Monitor department at [phone].  If your monitor becomes loose or falls off after 4 days call [employer] at [phone] for suggestions on securing your monitor.      Follow-Up: At [facility], you and your health needs are our priority.  As part of our continuing mission to provide you with exceptional heart care, we have created designated Provider Care Teams.  These Care Teams include your primary Cardiologist (physician) and Advanced Practice Providers (APPs -  Physician Assistants and Nurse Practitioners) who all work together to provide you with the care you need, when you need it.   Your next appointment:   As needed per Dr. Anne Fu

## 2020-09-21 NOTE — Progress Notes (Signed)
Cardiology Office Note:    Date:  09/21/2020   ID:  Velna D [name], DOB [birth year], MRN [id number]  PCP:  [name], Betty G, MD   [facility]  Cardiologist:  Donato Schultz, MD  Advanced Practice Provider:  No care team member to display Electrophysiologist:  None      Referring MD: [name], Betty G, MD     History of Present Illness:    Shelly [name] is a 40 y.o. female here for the evaluation of irregular heartbeat at the request of Dr. Betty [name]  During a physical exam notable irregular heart rate was noted.  She remembers 1 episode of "a little something "in her chest when she was in bed.  Occasionally she may feel winded with moderate activity but this may be deconditioning.  No chest pain, no diaphoresis.  GF had MI but doing well.   EKG on 09/04/2020 showed normal axis sinus rhythm PVCs and T wave abnormalities.  Unfortunately, I am unable to view this in epic.  EKG done today shows sinus rhythm 97 with PVCs 3 of them were noted on her twelve-lead.  Once again, she is not having any major symptoms with these.  Sometimes she will hear them when she lays down on her pillow or in her ear.  Does not seem to hit her.  LDL cholesterol 160.  TSH was normal.  Hemoglobin A1c 5.8 hemoglobin 11.5 creatinine 0.7    Past Medical History:  Diagnosis Date  . Allergy   . Depression    history of   . Eye strain    patient has digitial eye strain that is being address at [facility]  . History of kidney stones 2008    Past Surgical History:  Procedure Laterality Date  . WISDOM TOOTH EXTRACTION  2011    Current Medications: Current Meds  Medication Sig  . cetirizine (ZYRTEC) 10 MG tablet Take 10 mg by mouth as needed.  . Multiple Vitamins-Minerals (MULTIVITAMIN ADULT, MINERALS,) TABS Take 1 tablet by mouth daily.     Allergies:   Other   Social History   Socioeconomic History  . Marital status: Single    Spouse name: Not on file  .  Number of children: Not on file  . Years of education: Not on file  . Highest education level: Not on file  Occupational History  . Not on file  Tobacco Use  . Smoking status: Never Smoker  . Smokeless tobacco: Never Used  Substance and Sexual Activity  . Alcohol use: Yes    Alcohol/week: 0.0 standard drinks    Comment: occasional  . Drug use: No  . Sexual activity: Never  Other Topics Concern  . Not on file  Social History Narrative  . Not on file   Social Determinants of Health   Financial Resource Strain: Not on file  Food Insecurity: Not on file  Transportation Needs: Not on file  Physical Activity: Not on file  Stress: Not on file  Social Connections: Not on file     Family History: The patient's family history includes Arthritis in her maternal grandfather, maternal grandmother, and paternal grandmother; Asthma in her maternal grandmother; Diabetes in her maternal grandmother; Heart attack in her paternal grandmother; Hyperlipidemia in her paternal grandmother; Hypertension in her maternal grandfather, maternal grandmother, mother, and paternal grandmother.  ROS:   Please see the history of present illness.     All other systems reviewed and are negative.  EKGs/Labs/Other Studies  Reviewed:    The following studies were reviewed today: Prior office note EKG reviewed  EKG:  EKG is  ordered today.  The ekg ordered today demonstrates sinus rhythm 97 PVCs  Recent Labs: 09/04/2020: BUN 12; Creatinine, Ser 0.79; Hemoglobin 11.5; Platelets 261.0; Potassium 3.9; Sodium 140; TSH 0.71  Recent Lipid Panel    Component Value Date/Time   CHOL 234 (H) 09/04/2020 0930   CHOL 219 (H) 04/28/2015 0915   TRIG 69.0 09/04/2020 0930   HDL 60.20 09/04/2020 0930   HDL 72 04/28/2015 0915   CHOLHDL 4 09/04/2020 0930   VLDL 13.8 09/04/2020 0930   LDLCALC 160 (H) 09/04/2020 0930   LDLCALC 135 (H) 04/28/2015 0915     Risk Assessment/Calculations:      Physical Exam:    VS:   BP 130/80 (BP Location: Left Arm, Patient Position: Sitting, Cuff Size: Large)   Pulse 100   Ht 5' 7.5" (1.715 m)   Wt 272 lb (123.4 kg)   SpO2 99%   BMI 41.97 kg/m     Wt Readings from Last 3 Encounters:  09/21/20 272 lb (123.4 kg)  09/04/20 272 lb (123.4 kg)  05/26/18 261 lb (118.4 kg)     GEN:  Well nourished, well developed in no acute distress HEENT: Normal NECK: No JVD; No carotid bruits LYMPHATICS: No lymphadenopathy CARDIAC: RRR, no murmurs, rubs, gallops RESPIRATORY:  Clear to auscultation without rales, wheezing or rhonchi  ABDOMEN: Soft, non-tender, non-distended MUSCULOSKELETAL:  No edema; No deformity  SKIN: Warm and dry NEUROLOGIC:  Alert and oriented x 3 PSYCHIATRIC:  Normal affect   ASSESSMENT:    1. Palpitations   2. PVC (premature ventricular contraction)    PLAN:    In order of problems listed above:  Palpitations PVCs/abnormal EKG -Frequent PVCs noted on ECG.  We will go ahead and check a Zio patch monitor to quantify. -We will check an echocardiogram to ensure proper structure and function of her heart. -Avoidance of caffeine, stimulants. -If PVC burden is quite large, we will consider low-dose metoprolol.  Hyperlipidemia -LDL 160 -Diet, exercise.  With her grandfather having a heart attack, and a few years, perhaps age 34, a calcium score may be helpful for her to help determine whether or not statin therapy would be of higher benefit for her.  We will follow up with studies.   Medication Adjustments/Labs and Tests Ordered: Current medicines are reviewed at length with the patient today.  Concerns regarding medicines are outlined above.  Orders Placed This Encounter  Procedures  . LONG TERM MONITOR (3-14 DAYS)  . ECHOCARDIOGRAM COMPLETE   No orders of the defined types were placed in this encounter.   Patient Instructions  Medication Instructions:  Your physician recommends that you continue on your current medications as directed.  Please refer to the Current Medication list given to you today. *If you need a refill on your cardiac medications before your next appointment, please call your pharmacy*   Lab Work: none If you have labs (blood work) drawn today and your tests are completely normal, you will receive your results only by: Marland Kitchen MyChart Message (if you have MyChart) OR . A paper copy in the mail If you have any lab test that is abnormal or we need to change your treatment, we will call you to review the results.   Testing/Procedures: Your physician has requested that you have an echocardiogram. Echocardiography is a painless test that uses sound waves to create images of your heart.  It provides your doctor with information about the size and shape of your heart and how well your heart's chambers and valves are working. This procedure takes approximately one hour. There are no restrictions for this procedure.   ZIO XT- Long Term Monitor Instructions   Your physician has requested you wear your ZIO patch monitor_______days.   This is a single patch monitor.  [employer] supplies one patch monitor per enrollment.  Additional stickers are not available.   Please do not apply patch if you will be having a Nuclear Stress Test, Echocardiogram, Cardiac CT, MRI, or Chest Xray during the time frame you would be wearing the monitor. The patch cannot be worn during these tests.  You cannot remove and re-apply the ZIO XT patch monitor.   Your ZIO patch monitor will be sent USPS Priority mail from [facility] directly to your home address. The monitor may also be mailed to a PO BOX if home delivery is not available.   It may take 3-5 days to receive your monitor after you have been enrolled.   Once you have received you monitor, please review enclosed instructions.  Your monitor has already been registered assigning a specific monitor serial # to you.   Applying the monitor   Shave hair from upper left chest.    Hold abrader disc by orange tab.  Rub abrader in 40 strokes over left upper chest as indicated in your monitor instructions.   Clean area with 4 enclosed alcohol pads .  Use all pads to assure are is cleaned thoroughly.  Let dry.   Apply patch as indicated in monitor instructions.  Patch will be place under collarbone on left side of chest with arrow pointing upward.   Rub patch adhesive wings for 2 minutes.Remove white label marked "1".  Remove white label marked "2".  Rub patch adhesive wings for 2 additional minutes.   While looking in a mirror, press and release button in center of patch.  A small green light will flash 3-4 times .  This will be your only indicator the monitor has been turned on.     Do not shower for the first 24 hours.  You may shower after the first 24 hours.   Press button if you feel a symptom. You will hear a small click.  Record Date, Time and Symptom in the Patient Log Book.   When you are ready to remove patch, follow instructions on last 2 pages of Patient Log Book.  Stick patch monitor onto last page of Patient Log Book.   Place Patient Log Book in [place] [facility] [place] box.  Use locking tab on box and tape box closed securely.  The Orange and [employer] box has [employer] postage on it.  Please place in mailbox as soon as possible.  Your physician should have your test results approximately 7 days after the monitor has been mailed back to [facility].   Call [facility] Customer Care at [phone] if you have questions regarding your ZIO XT patch monitor.  Call them immediately if you see an orange light blinking on your monitor.   If your monitor falls off in less than 4 days contact our Monitor department at [phone].  If your monitor becomes loose or falls off after 4 days call [employer] at [phone] for suggestions on securing your monitor.     Follow-Up: At [facility], you and your health needs are our priority.  As part of our continuing mission to provide  you with  exceptional heart care, we have created designated Provider Care Teams.  These Care Teams include your primary Cardiologist (physician) and Advanced Practice Providers (APPs -  Physician Assistants and Nurse Practitioners) who all work together to provide you with the care you need, when you need it.   Your next appointment:   As needed per Dr. Anne Fu        Signed, Donato Schultz, MD  09/21/2020 1:00 PM    [facility]

## 2020-10-17 ENCOUNTER — Encounter (HOSPITAL_COMMUNITY): Payer: Self-pay | Admitting: Cardiology

## 2020-10-17 ENCOUNTER — Other Ambulatory Visit (HOSPITAL_COMMUNITY): Payer: BC Managed Care – PPO

## 2020-10-17 ENCOUNTER — Encounter (HOSPITAL_COMMUNITY): Payer: Self-pay

## 2020-10-25 NOTE — Addendum Note (Signed)
Addended by: Louanne Belton, Opie Fanton A on: 10/25/2020 09:08 AM   Modules accepted: Orders

## 2020-11-13 ENCOUNTER — Telehealth (HOSPITAL_COMMUNITY): Payer: Self-pay | Admitting: Cardiology

## 2020-11-13 NOTE — Telephone Encounter (Signed)
Patient has called and cancelled echocardiogram and did not wish to reschedule. Order will be removed from the ECHO WQ and if she calls back we can reinstate the order. Thank you.

## 2020-11-15 ENCOUNTER — Other Ambulatory Visit (HOSPITAL_COMMUNITY): Payer: BC Managed Care – PPO

## 2021-02-15 DIAGNOSIS — H9209 Otalgia, unspecified ear: Secondary | ICD-10-CM | POA: Diagnosis not present

## 2021-02-15 DIAGNOSIS — J019 Acute sinusitis, unspecified: Secondary | ICD-10-CM | POA: Diagnosis not present

## 2022-06-24 NOTE — Progress Notes (Unsigned)
HPI: Shelly [name] is a 41 y.o. female with medical history significant for hyperlipidemia, goiter,anxiety, and allergy rhinitis here today for her routine physical.  Last CPE: 09/04/2020 She admits to having a poor diet, eating out 95% of the time, and not exercising regularly.   Immunization History  Administered Date(s) Administered   Influenza,inj,Quad PF,6+ Mos 05/30/2016   Tdap 05/30/2011, 06/25/2022   Health Maintenance  Topic Date Due   PAP SMEAR-Modifier  07/22/2022   COVID-19 Vaccine (1) 07/11/2022 (Originally 07/02/1981)   INFLUENZA VACCINE  10/20/2022 (Originally 02/19/2022)   HIV Screening  06/25/2025 (Originally 12/30/1993)   DTaP/Tdap/Td (3 - Td or Tdap) 06/25/2032   Hepatitis C Screening  Completed   HPV VACCINES  Aged Out   She has not had any abnormal pap smears and denies any vaginal discharge or need for STD testing.  She has not had a mammogram and her last menstrual period was at the end of October/2023. She is not using any form of birth control, denies being sexually active for the past year. A.m.: 11-12 G: 1 A: 1 at age 60-21.  She reports concerns about her blood sugar and the potential of being pre-diabetic or diabetic.  Last hemoglobin A1c in 08/2020 was 5.8. She has been experiencing blurry vision at times and difficulty seeing without light for a few months. She has not seen an eye doctor for this issue.  Negative for conjunctival erythema or eye pain.  She also reports feeling tired all the time and not feeling rested upon waking up in the morning, despite sleeping for five to six hours in average.  No known sleep apnea. Interrupted sleep, she wakes up a few times throughout the night. History of iron deficiency anemia, she is not on iron supplementation. Lab Results  Component Value Date   WBC 4.8 09/04/2020   HGB 11.5 (L) 09/04/2020   HCT 35.3 (L) 09/04/2020   MCV 70.1 (L) 09/04/2020   PLT 261.0 09/04/2020      06/25/2022    9:27 AM  09/07/2020    9:21 PM 05/26/2018    2:12 PM 04/01/2018   11:59 AM 05/26/2015    8:14 AM  Depression screen PHQ 2/9  Decreased Interest 1 0 1 2 0  Down, Depressed, Hopeless 0 0 1 1 0  PHQ - 2 Score 1 0 2 3 0  Altered sleeping 1 0 1 2   Tired, decreased energy 1 1 0 3   Change in appetite 0 1 0 1   Feeling bad or failure about yourself  0 0 2 0   Trouble concentrating 1 0 0 3   Moving slowly or fidgety/restless 0 0 0 0   Suicidal thoughts 0 0 0 0   PHQ-9 Score 4 2 5 12    Difficult doing work/chores Somewhat difficult Somewhat difficult Not difficult at all Very difficult    Hyperlipidemia on nonpharmacologic treatment. Lab Results  Component Value Date   CHOL 234 (H) 09/04/2020   HDL 60.20 09/04/2020   LDLCALC 160 (H) 09/04/2020   TRIG 69.0 09/04/2020   CHOLHDL 4 09/04/2020   Goiter: She has not noted growth. Lab Results  Component Value Date   TSH 0.71 09/04/2020   Review of Systems  Constitutional:  Positive for fatigue. Negative for activity change, appetite change and fever.  HENT:  Negative for hearing loss, mouth sores and sore throat.   Eyes:  Positive for visual disturbance. Negative for redness.  Respiratory:  Negative for cough, shortness  of breath and wheezing.   Cardiovascular:  Negative for chest pain and leg swelling.  Gastrointestinal:  Negative for abdominal pain, nausea and vomiting.       No changes in bowel habits.  Endocrine: Negative for cold intolerance, heat intolerance, polydipsia, polyphagia and polyuria.  Genitourinary:  Positive for menstrual problem (irregular.). Negative for decreased urine volume, dysuria, hematuria, vaginal bleeding and vaginal discharge.  Musculoskeletal:  Negative for gait problem and myalgias.  Skin:  Negative for color change and rash.  Allergic/Immunologic: Positive for environmental allergies.  Neurological:  Negative for seizures, syncope, weakness and headaches.  Hematological:  Negative for adenopathy. Does not  bruise/bleed easily.  Psychiatric/Behavioral:  Positive for sleep disturbance. Negative for confusion. The patient is not nervous/anxious.   All other systems reviewed and are negative.  Current Outpatient Medications on File Prior to Visit  Medication Sig Dispense Refill   cetirizine (ZYRTEC) 10 MG tablet Take 10 mg by mouth as needed.     Multiple Vitamins-Minerals (MULTIVITAMIN ADULT, MINERALS,) TABS Take 1 tablet by mouth daily.     No current facility-administered medications on file prior to visit.   Past Medical History:  Diagnosis Date   Allergy    Depression    history of    Eye strain    patient has digitial eye strain that is being address at [facility]   History of kidney stones 2008    Past Surgical History:  Procedure Laterality Date   WISDOM TOOTH EXTRACTION  2011   Allergies  Allergen Reactions   Other    Family History  Problem Relation Age of Onset   Hypertension Mother    Arthritis Maternal Grandmother    Asthma Maternal Grandmother    Diabetes Maternal Grandmother    Hypertension Maternal Grandmother    Arthritis Maternal Grandfather    Hypertension Maternal Grandfather    Arthritis Paternal Grandmother    Hypertension Paternal Grandmother    Hyperlipidemia Paternal Grandmother    Heart attack Paternal Grandmother        06/2014; received 5 stents    Social History   Socioeconomic History   Marital status: Single    Spouse name: Not on file   Number of children: Not on file   Years of education: Not on file   Highest education level: Not on file  Occupational History   Not on file  Tobacco Use   Smoking status: Never   Smokeless tobacco: Never  Substance and Sexual Activity   Alcohol use: Yes    Alcohol/week: 0.0 standard drinks of alcohol    Comment: occasional   Drug use: No   Sexual activity: Never  Other Topics Concern   Not on file  Social History Narrative   Not on file   Social Determinants of Health    Financial Resource Strain: Not on file  Food Insecurity: Not on file  Transportation Needs: Not on file  Physical Activity: Not on file  Stress: Not on file  Social Connections: Not on file    Vitals:   06/25/22 0921  BP: 128/80  Pulse: 100  Resp: 16  Temp: 99.3 F (37.4 C)  SpO2: 97%  Body mass index is 41.66 kg/m.  Wt Readings from Last 3 Encounters:  06/25/22 270 lb (122.5 kg)  09/21/20 272 lb (123.4 kg)  09/04/20 272 lb (123.4 kg)  Physical Exam Vitals and nursing note reviewed. Exam conducted with a chaperone present.  Constitutional:      General: She is  not in acute distress.    Appearance: She is well-developed.  HENT:     Head: Normocephalic and atraumatic.     Right Ear: Hearing, tympanic membrane, ear canal and external ear normal.     Left Ear: Hearing, tympanic membrane, ear canal and external ear normal.     Mouth/Throat:     Mouth: Mucous membranes are moist.     Pharynx: Oropharynx is clear. Uvula midline.  Eyes:     Conjunctiva/sclera: Conjunctivae normal.     Pupils: Pupils are equal, round, and reactive to light.  Neck:     Thyroid: Thyromegaly (thyroid palpable.) present. No thyroid mass.  Cardiovascular:     Rate and Rhythm: Normal rate and regular rhythm.     Pulses:          Dorsalis pedis pulses are 2+ on the right side and 2+ on the left side.     Heart sounds: No murmur heard. Pulmonary:     Effort: Pulmonary effort is normal. No respiratory distress.     Breath sounds: Normal breath sounds.  Chest:  Breasts:    Right: No swelling, inverted nipple, mass, nipple discharge, skin change or tenderness.     Left: No swelling, inverted nipple, mass, nipple discharge, skin change or tenderness.  Abdominal:     Palpations: Abdomen is soft. There is no hepatomegaly or mass.     Tenderness: There is no abdominal tenderness.  Genitourinary:    General: Normal vulva.     Exam position: Lithotomy position.     Labia:        Right: No rash,  tenderness or lesion.        Left: No rash, tenderness or lesion.      Vagina: No signs of injury and foreign body. No vaginal discharge, erythema, tenderness, bleeding or lesions.     Cervix: No cervical motion tenderness, discharge, friability or erythema.     Uterus: Not enlarged and not tender.      Adnexa:        Right: No mass, tenderness or fullness.         Left: No mass, tenderness or fullness.          Comments: Pap smear collected. < 1 mm nabothian cyst at 1 O'clock  Musculoskeletal:     Right lower leg: No edema.     Left lower leg: No edema.     Comments: No major deformity or signs of synovitis appreciated.  Lymphadenopathy:     Cervical: No cervical adenopathy.     Upper Body:     Right upper body: No supraclavicular adenopathy.     Left upper body: No supraclavicular adenopathy.     Lower Body: No right inguinal adenopathy. No left inguinal adenopathy.  Skin:    General: Skin is warm.     Findings: No erythema or rash.  Neurological:     General: No focal deficit present.     Mental Status: She is alert and oriented to person, place, and time.     Cranial Nerves: No cranial nerve deficit.     Coordination: Coordination normal.     Gait: Gait normal.     Deep Tendon Reflexes:     Reflex Scores:      Bicep reflexes are 2+ on the right side and 2+ on the left side.      Patellar reflexes are 2+ on the right side and 2+ on the left side. Psychiatric:  Speech: Speech normal.     Comments: Well groomed, good eye contact.   ASSESSMENT AND PLAN: Ms. Shelly [name] was here today annual physical examination.  Orders Placed This Encounter  Procedures   Tdap vaccine greater than or equal to 7yo IM   CBC   TSH   Lipid panel   Hemoglobin A1c   Comprehensive metabolic panel   Lab Results  Component Value Date   CHOL 234 (H) 09/04/2020   HDL 60.20 09/04/2020   LDLCALC 160 (H) 09/04/2020   TRIG 69.0 09/04/2020   CHOLHDL 4 09/04/2020   Lab Results   Component Value Date   HGBA1C 5.8 09/04/2020   Lab Results  Component Value Date   TSH 0.71 09/04/2020   Lab Results  Component Value Date   WBC 4.8 09/04/2020   HGB 11.5 (L) 09/04/2020   HCT 35.3 (L) 09/04/2020   MCV 70.1 (L) 09/04/2020   PLT 261.0 09/04/2020   Lab Results  Component Value Date   CREATININE 0.79 09/04/2020   BUN 12 09/04/2020   NA 140 09/04/2020   K 3.9 09/04/2020   CL 107 09/04/2020   CO2 24 09/04/2020   Lab Results  Component Value Date   ALT 12 04/28/2015   AST 13 04/28/2015   ALKPHOS 55 04/28/2015   BILITOT <0.2 04/28/2015   Routine general medical examination at a health care facility Assessment & Plan: We discussed the importance of regular physical activity and healthy diet for prevention of chronic illness and/or complications. Preventive guidelines reviewed. Vaccination updated. Pap smear collected today. Next CPE in a year.   Hyperlipidemia, unspecified hyperlipidemia type Assessment & Plan: Non pharmacologic treatment recommended for now. Further recommendations will be given according to 10 years CVD risk score and lipid panel numbers.  Orders: -     Lipid panel; Future -     Comprehensive metabolic panel; Future  Screening for endocrine, metabolic and immunity disorder -     Hemoglobin A1c; Future  Goiter diffuse Assessment & Plan: Problem has been stable. TSH has been normal, will continue checking it annually.   Orders: -     TSH; Future  Fatigue, unspecified type Assessment & Plan: Possible etiologies discussed, anemia as well as anxiety can be contributing factors. Interrupted sleep may be a contributing factor. Regular physical activity and following a healthful diet consistently may help. Further recommendation will be given according to lab results.  Orders: -     CBC; Future  Cervical cancer screening -     Cytology - PAP  Need for Tdap vaccination -     Tdap vaccine greater than or equal to 7yo  IM  Obesity, Class III, BMI 40-49.9 (morbid obesity) (HCC) Assessment & Plan: Weight has been otherwise stable. We discussed benefits of weight loss as well as adverse effects, which include risk for diabetes and CVD. Encourage regular physical activity, walking for 15 to 20 minutes daily is a good option, and consistency with following a healthful diet.  Pre meals during weekends may help.   Return in 1 year (on 06/26/2023) for CPE.  Elona Yinger G. [place], MD  [facility]. [place] office.

## 2022-06-25 ENCOUNTER — Ambulatory Visit (INDEPENDENT_AMBULATORY_CARE_PROVIDER_SITE_OTHER): Payer: 59 | Admitting: Family Medicine

## 2022-06-25 ENCOUNTER — Other Ambulatory Visit (HOSPITAL_COMMUNITY)
Admission: RE | Admit: 2022-06-25 | Discharge: 2022-06-25 | Disposition: A | Discharge status: Home / Self Care | Payer: 59 | Source: Ambulatory Visit | Attending: Family Medicine | Admitting: Family Medicine

## 2022-06-25 ENCOUNTER — Encounter: Payer: Self-pay | Admitting: Family Medicine

## 2022-06-25 VITALS — BP 128/80 | HR 100 | Temp 99.3°F | Resp 16 | Ht 67.5 in | Wt 270.0 lb

## 2022-06-25 DIAGNOSIS — Z0000 Encounter for general adult medical examination without abnormal findings: Secondary | ICD-10-CM | POA: Diagnosis not present

## 2022-06-25 DIAGNOSIS — E785 Hyperlipidemia, unspecified: Secondary | ICD-10-CM | POA: Diagnosis not present

## 2022-06-25 DIAGNOSIS — Z1329 Encounter for screening for other suspected endocrine disorder: Secondary | ICD-10-CM

## 2022-06-25 DIAGNOSIS — R5383 Other fatigue: Secondary | ICD-10-CM | POA: Diagnosis not present

## 2022-06-25 DIAGNOSIS — Z13228 Encounter for screening for other metabolic disorders: Secondary | ICD-10-CM | POA: Diagnosis not present

## 2022-06-25 DIAGNOSIS — Z124 Encounter for screening for malignant neoplasm of cervix: Secondary | ICD-10-CM | POA: Diagnosis present

## 2022-06-25 DIAGNOSIS — E040 Nontoxic diffuse goiter: Secondary | ICD-10-CM

## 2022-06-25 DIAGNOSIS — Z23 Encounter for immunization: Secondary | ICD-10-CM

## 2022-06-25 DIAGNOSIS — Z130 Encounter for screening for diseases of the blood and blood-forming organs and certain disorders involving the immune mechanism: Secondary | ICD-10-CM | POA: Diagnosis not present

## 2022-06-25 LAB — HEMOGLOBIN A1C: Hgb A1c MFr Bld: 5.7 % (ref 4.6–6.5)

## 2022-06-25 LAB — COMPREHENSIVE METABOLIC PANEL
ALT: 11 U/L (ref 0–35)
AST: 14 U/L (ref 0–37)
Albumin: 4.5 g/dL (ref 3.5–5.2)
Alkaline Phosphatase: 49 U/L (ref 39–117)
BUN: 13 mg/dL (ref 6–23)
CO2: 26 mEq/L (ref 19–32)
Calcium: 9.3 mg/dL (ref 8.4–10.5)
Chloride: 107 mEq/L (ref 96–112)
Creatinine, Ser: 0.79 mg/dL (ref 0.40–1.20)
GFR: 91.57 mL/min (ref >60.00)
Glucose, Bld: 83 mg/dL (ref 70–99)
Potassium: 3.7 mEq/L (ref 3.5–5.1)
Sodium: 139 mEq/L (ref 135–145)
Total Bilirubin: 0.3 mg/dL (ref 0.2–1.2)
Total Protein: 7.4 g/dL (ref 6.0–8.3)

## 2022-06-25 LAB — CBC
HCT: 38 % (ref 36.0–46.0)
Hemoglobin: 12.5 g/dL (ref 12.0–15.0)
MCHC: 32.8 g/dL (ref 30.0–36.0)
MCV: 70.3 fl — ABNORMAL LOW (ref 78.0–100.0)
Platelets: 270 10*3/uL (ref 150.0–400.0)
RBC: 5.41 Mil/uL — ABNORMAL HIGH (ref 3.87–5.11)
RDW: 17.9 % — ABNORMAL HIGH (ref 11.5–15.5)
WBC: 6.3 10*3/uL (ref 4.0–10.5)

## 2022-06-25 LAB — LIPID PANEL
Cholesterol: 237 mg/dL — ABNORMAL HIGH (ref 0–200)
HDL: 63.3 mg/dL (ref >39.00)
LDL Cholesterol: 161 mg/dL — ABNORMAL HIGH (ref 0–99)
NonHDL: 173.44
Total CHOL/HDL Ratio: 4
Triglycerides: 64 mg/dL (ref 0.0–149.0)
VLDL: 12.8 mg/dL (ref 0.0–40.0)

## 2022-06-25 LAB — TSH: TSH: 1.58 u[IU]/mL (ref 0.35–5.50)

## 2022-06-25 NOTE — Assessment & Plan Note (Signed)
We discussed the importance of regular physical activity and healthy diet for prevention of chronic illness and/or complications. Preventive guidelines reviewed. Vaccination updated. Pap smear collected today. Next CPE in a year.

## 2022-06-25 NOTE — Assessment & Plan Note (Addendum)
Possible etiologies discussed, anemia as well as anxiety can be contributing factors. Interrupted sleep may be a contributing factor. Regular physical activity and following a healthful diet consistently may help. Further recommendation will be given according to lab results.

## 2022-06-25 NOTE — Assessment & Plan Note (Signed)
Non pharmacologic treatment recommended for now. Further recommendations will be given according to 10 years CVD risk score and lipid panel numbers. 

## 2022-06-25 NOTE — Patient Instructions (Addendum)
A few things to remember from today's visit:  Routine general medical examination at a health care facility  Hyperlipidemia, unspecified hyperlipidemia type - Plan: Lipid panel, Comprehensive metabolic panel  Screening for endocrine, metabolic and immunity disorder - Plan: Hemoglobin A1c  Goiter diffuse - Plan: TSH  Fatigue, unspecified type - Plan: CBC  Cervical cancer screening - Plan: Cytology - PAP ([place])  Please call to re-schedule your mammogram. Do not use My Chart to request refills or for acute issues that need immediate attention. If you send a my chart message, it may take a few days to be addressed, specially if I am not in the office.  Please be sure medication list is accurate. If a new problem present, please set up appointment sooner than planned today.  Health Maintenance, Female Adopting a healthy lifestyle and getting preventive care are important in promoting health and wellness. Ask your health care provider about: The right schedule for you to have regular tests and exams. Things you can do on your own to prevent diseases and keep yourself healthy. What should I know about diet, weight, and exercise? Eat a healthy diet  Eat a diet that includes plenty of vegetables, fruits, low-fat dairy products, and lean protein. Do not eat a lot of foods that are high in solid fats, added sugars, or sodium. Maintain a healthy weight Body mass index (BMI) is used to identify weight problems. It estimates body fat based on height and weight. Your health care provider can help determine your BMI and help you achieve or maintain a healthy weight. Get regular exercise Get regular exercise. This is one of the most important things you can do for your health. Most adults should: Exercise for at least 150 minutes each week. The exercise should increase your heart rate and make you sweat (moderate-intensity exercise). Do strengthening exercises at least twice a week. This is  in addition to the moderate-intensity exercise. Spend less time sitting. Even light physical activity can be beneficial. Watch cholesterol and blood lipids Have your blood tested for lipids and cholesterol at 43 years of age, then have this test every 5 years. Have your cholesterol levels checked more often if: Your lipid or cholesterol levels are high. You are older than 43 years of age. You are at high risk for heart disease. What should I know about cancer screening? Depending on your health history and family history, you may need to have cancer screening at various ages. This may include screening for: Breast cancer. Cervical cancer. Colorectal cancer. Skin cancer. Lung cancer. What should I know about heart disease, diabetes, and high blood pressure? Blood pressure and heart disease High blood pressure causes heart disease and increases the risk of stroke. This is more likely to develop in people who have high blood pressure readings or are overweight. Have your blood pressure checked: Every 3-5 years if you are 36-75 years of age. Every year if you are 15 years old or older. Diabetes Have regular diabetes screenings. This checks your fasting blood sugar level. Have the screening done: Once every three years after age 48 if you are at a normal weight and have a low risk for diabetes. More often and at a younger age if you are overweight or have a high risk for diabetes. What should I know about preventing infection? Hepatitis B If you have a higher risk for hepatitis B, you should be screened for this virus. Talk with your health care provider to find out if you  are at risk for hepatitis B infection. Hepatitis C Testing is recommended for: Everyone born from [birth year] through 1967. Anyone with known risk factors for hepatitis C. Sexually transmitted infections (STIs) Get screened for STIs, including gonorrhea and chlamydia, if: You are sexually active and are younger than 43 years  of age. You are older than 43 years of age and your health care provider tells you that you are at risk for this type of infection. Your sexual activity has changed since you were last screened, and you are at increased risk for chlamydia or gonorrhea. Ask your health care provider if you are at risk. Ask your health care provider about whether you are at high risk for HIV. Your health care provider may recommend a prescription medicine to help prevent HIV infection. If you choose to take medicine to prevent HIV, you should first get tested for HIV. You should then be tested every 3 months for as long as you are taking the medicine. Pregnancy If you are about to stop having your period (premenopausal) and you may become pregnant, seek counseling before you get pregnant. Take 400 to 800 micrograms (mcg) of folic acid every day if you become pregnant. Ask for birth control (contraception) if you want to prevent pregnancy. Osteoporosis and menopause Osteoporosis is a disease in which the bones lose minerals and strength with aging. This can result in bone fractures. If you are 54 years old or older, or if you are at risk for osteoporosis and fractures, ask your health care provider if you should: Be screened for bone loss. Take a calcium or vitamin D supplement to lower your risk of fractures. Be given hormone replacement therapy (HRT) to treat symptoms of menopause. Follow these instructions at home: Alcohol use Do not drink alcohol if: Your health care provider tells you not to drink. You are pregnant, may be pregnant, or are planning to become pregnant. If you drink alcohol: Limit how much you have to: 0-1 drink a day. Know how much alcohol is in your drink. In the [place] one drink equals one 12 oz bottle of beer (355 mL), one 5 oz glass of wine (148 mL), or one 1 oz glass of hard liquor (44 mL). Lifestyle Do not use any products that contain nicotine or tobacco. These products include  cigarettes, chewing tobacco, and vaping devices, such as e-cigarettes. If you need help quitting, ask your health care provider. Do not use street drugs. Do not share needles. Ask your health care provider for help if you need support or information about quitting drugs. General instructions Schedule regular health, dental, and eye exams. Stay current with your vaccines. Tell your health care provider if: You often feel depressed. You have ever been abused or do not feel safe at home. Summary Adopting a healthy lifestyle and getting preventive care are important in promoting health and wellness. Follow your health care provider's instructions about healthy diet, exercising, and getting tested or screened for diseases. Follow your health care provider's instructions on monitoring your cholesterol and blood pressure. This information is not intended to replace advice given to you by your health care provider. Make sure you discuss any questions you have with your health care provider. Document Revised: 11/27/2020 Document Reviewed: 11/27/2020 Elsevier Patient Education  [place].

## 2022-06-25 NOTE — Assessment & Plan Note (Signed)
Problem has been stable. TSH has been normal, will continue checking it annually.

## 2022-06-25 NOTE — Assessment & Plan Note (Signed)
Weight has been otherwise stable. We discussed benefits of weight loss as well as adverse effects, which include risk for diabetes and CVD. Encourage regular physical activity, walking for 15 to 20 minutes daily is a good option, and consistency with following a healthful diet.  Pre meals during weekends may help.

## 2022-06-26 LAB — CYTOLOGY - PAP
Comment: NEGATIVE
Diagnosis: NEGATIVE
High risk HPV: NEGATIVE

## 2022-07-05 ENCOUNTER — Other Ambulatory Visit: Payer: Self-pay

## 2022-07-05 MED ORDER — PRAVASTATIN SODIUM 20 MG PO TABS: 20.0000 mg | ORAL_TABLET | Freq: Every day | ORAL | 3 refills | Status: AC

## 2022-08-26 ENCOUNTER — Telehealth: Payer: Self-pay

## 2022-08-26 DIAGNOSIS — E785 Hyperlipidemia, unspecified: Secondary | ICD-10-CM

## 2022-08-26 NOTE — Telephone Encounter (Signed)
-----   Message from Rodrigo Ran, [place] sent at 07/05/2022  3:23 PM EST ----- Lipid in 6-8 weeks

## 2022-09-05 ENCOUNTER — Ambulatory Visit: Payer: 59 | Admitting: Family Medicine

## 2022-09-05 VITALS — BP 160/96 | HR 100 | Temp 98.7°F | Ht 67.5 in | Wt 260.8 lb

## 2022-09-05 DIAGNOSIS — F419 Anxiety disorder, unspecified: Secondary | ICD-10-CM

## 2022-09-05 DIAGNOSIS — Z566 Other physical and mental strain related to work: Secondary | ICD-10-CM

## 2022-09-05 NOTE — Progress Notes (Signed)
Established Patient Office Visit   Subjective  Patient ID: Shelly [name], female    DOB: [birth year]  Age: 42 y.o. MRN: [id number]  Chief Complaint  Patient presents with   Anxiety    Pt is a 42 yo female followed by Dr. Martinique, seen for acute concern.  Pt dealing with increased stress at work in the last few wks.  Pt states a supervisor filled an [place] complaint which led to that person being targeted, and subsequently leaving the job.  Pt states she feels she is being targeted as she was friends with the supervisor.  Pt reached out to HR, but has yet to hear a response.  Pt notes feeling sick when having to get ready for work.  Pt considering her employment options.  States she is a single mother.  Pt states she was dealing with prior grief well until this happened at work.  Pt states winter is difficult for her due to the death of several family members around this time.  Pt states in the past she went to grief counseling.  Pt was on Sertraline a few yrs ago and would like to restart.  Pt would also like to be out of work for several wks.      ROS Negative unless stated above    Objective:     BP (!) 160/96 (BP Location: Left Arm, Patient Position: Sitting, Cuff Size: Large)   Pulse 100   Temp 98.7 F (37.1 C) (Oral)   Ht 5' 7.5" (1.715 m)   Wt 260 lb 12.8 oz (118.3 kg)   SpO2 98%   BMI 40.24 kg/m    Physical Exam Constitutional:      General: She is not in acute distress.    Appearance: Normal appearance.  HENT:     Head: Normocephalic and atraumatic.     Nose: Nose normal.     Mouth/Throat:     Mouth: Mucous membranes are moist.  Eyes:     Extraocular Movements: Extraocular movements intact.     Conjunctiva/sclera: Conjunctivae normal.  Cardiovascular:     Rate and Rhythm: Normal rate.     Heart sounds: No murmur heard.    No gallop.  Pulmonary:     Effort: Pulmonary effort is normal. No respiratory distress.     Breath sounds: No wheezing, rhonchi or rales.   Skin:    General: Skin is warm and dry.  Neurological:     Mental Status: She is alert and oriented to person, place, and time.  Psychiatric:        Speech: Speech normal.        Cognition and Memory: Cognition normal.        Judgment: Judgment normal.     Comments: Tearful at times, frustrated        09/05/2022   11:35 AM 06/25/2022    9:27 AM 09/07/2020    9:21 PM  Depression screen PHQ 2/9  Decreased Interest 1 1 0  Down, Depressed, Hopeless 2 0 0  PHQ - 2 Score 3 1 0  Altered sleeping 3 1 0  Tired, decreased energy 3 1 1  $ Change in appetite 3 0 1  Feeling bad or failure about yourself  1 0 0  Trouble concentrating 2 1 0  Moving slowly or fidgety/restless 0 0 0  Suicidal thoughts 0 0 0  PHQ-9 Score 15 4 2  $ Difficult doing work/chores Not difficult at all Somewhat difficult Somewhat difficult  09/05/2022   11:35 AM 09/07/2020    9:22 PM  GAD 7 : Generalized Anxiety Score  Nervous, Anxious, on Edge 3 0  Control/stop worrying 3 0  Worry too much - different things 3 0  Trouble relaxing 2 0  Restless 0 1  Easily annoyed or irritable 2 0  Afraid - awful might happen 1 0  Total GAD 7 Score 14 1  Anxiety Difficulty Extremely difficult Somewhat difficult     No results found for any visits on 09/05/22.    Assessment & Plan:  Work-related stress  Anxiety   Patient with increased anxiety and depression symptoms 2/2 work-related stress.  Patient advised to consider counseling.  Will send in Rx for sertraline this visit but would be unable to take patient out of work times several weeks.  Advise would need to follow-up with PCP for consideration of extended leave.  Upon hearing this patient declines Rx for sertraline and upset that appointment was not made with PCP.  Return in about 1 day (around 09/06/2022) for with PCP.    On day of service, 31 minutes spent caring for this patient face-to-face, reviewing the chart, counseling and/or coordinating care for plan  and treatment of diagnosis below.     Billie Ruddy, MD

## 2022-09-06 ENCOUNTER — Telehealth (INDEPENDENT_AMBULATORY_CARE_PROVIDER_SITE_OTHER): Payer: 59 | Admitting: Family Medicine

## 2022-09-06 ENCOUNTER — Ambulatory Visit: Payer: 59 | Admitting: Family Medicine

## 2022-09-06 ENCOUNTER — Encounter: Payer: Self-pay | Admitting: Family Medicine

## 2022-09-06 VITALS — Ht 67.5 in

## 2022-09-06 DIAGNOSIS — F419 Anxiety disorder, unspecified: Secondary | ICD-10-CM

## 2022-09-06 DIAGNOSIS — R030 Elevated blood-pressure reading, without diagnosis of hypertension: Secondary | ICD-10-CM

## 2022-09-06 DIAGNOSIS — F331 Major depressive disorder, recurrent, moderate: Secondary | ICD-10-CM

## 2022-09-06 MED ORDER — SERTRALINE HCL 50 MG PO TABS: 50.0000 mg | ORAL_TABLET | Freq: Every day | ORAL | 3 refills | Status: AC

## 2022-09-06 NOTE — Progress Notes (Unsigned)
Virtual Visit via Video Note I connected with Shelly [name] on 09/07/22 by a video enabled telemedicine application and verified that I am speaking with the correct person using two identifiers. Location patient: home Location provider:work office Persons participating in the virtual visit: patient, provider  I discussed the limitations of evaluation and management by telemedicine and the availability of in person appointments. The patient expressed understanding and agreed to proceed.  Chief Complaint  Patient presents with   Anxiety   HPI: [name] is a 42 yo female with PMHx significant for HLD, eczema, and goiter c/o worsening anxiety over the past two months.  She reports stress at work. She is experiencing poor sleep, decreased appetite, and extreme fatigue. States that she has lost 10 pounds during this time. She was seen for this problem yesterday, when her BP was elevated at 160/96. She has not checked BP at home today. Negative for severe/frequent headache, visual changes, chest pain, dyspnea, palpitation, focal weakness, or edema.  For anxiety, she was previously on Sertraline in 2019, which she discontinued after feeling better. She has not been seeing a psychotherapist for the past few years.  Her  anxiety is primarily related to her work situation, causing her to feel "physically sick" when going to work. She reports that her anxiety is now affecting her home life, leading to a lack of motivation and persistent tiredness. She has missed work and needs an excuse note.     09/05/2022   11:35 AM 06/25/2022    9:27 AM 09/07/2020    9:21 PM 05/26/2018    2:12 PM 04/01/2018   11:59 AM  Depression screen PHQ 2/9  Decreased Interest 1 1 0 1 2  Down, Depressed, Hopeless 2 0 0 1 1  PHQ - 2 Score 3 1 0 2 3  Altered sleeping 3 1 0 1 2  Tired, decreased energy 3 1 1 $ 0 3  Change in appetite 3 0 1 0 1  Feeling bad or failure about yourself  1 0 0 2 0  Trouble concentrating 2 1 0 0  3  Moving slowly or fidgety/restless 0 0 0 0 0  Suicidal thoughts 0 0 0 0 0  PHQ-9 Score 15 4 2 5 12  $ Difficult doing work/chores Not difficult at all Somewhat difficult Somewhat difficult Not difficult at all Very difficult   ROS: See pertinent positives and negatives per HPI.  Past Medical History:  Diagnosis Date   Allergy    Depression    history of    Eye strain    patient has digitial eye strain that is being address at [facility]   History of kidney stones 2008    Past Surgical History:  Procedure Laterality Date   WISDOM TOOTH EXTRACTION  2011    Family History  Problem Relation Age of Onset   Hypertension Mother    Arthritis Maternal Grandmother    Asthma Maternal Grandmother    Diabetes Maternal Grandmother    Hypertension Maternal Grandmother    Arthritis Maternal Grandfather    Hypertension Maternal Grandfather    Arthritis Paternal Grandmother    Hypertension Paternal Grandmother    Hyperlipidemia Paternal Grandmother    Heart attack Paternal Grandmother        06/2014; received 5 stents   Social History   Socioeconomic History   Marital status: Single    Spouse name: Not on file   Number of children: Not on file   Years of education: Not  on file   Highest education level: Some college, no degree  Occupational History   Not on file  Tobacco Use   Smoking status: Never   Smokeless tobacco: Never  Substance and Sexual Activity   Alcohol use: Yes    Alcohol/week: 0.0 standard drinks of alcohol    Comment: occasional   Drug use: No   Sexual activity: Never  Other Topics Concern   Not on file  Social History Narrative   Not on file   Social Determinants of Health   Financial Resource Strain: Low Risk  (09/05/2022)   Overall Financial Resource Strain (CARDIA)    Difficulty of Paying Living Expenses: Not hard at all  Food Insecurity: No Food Insecurity (09/05/2022)   Hunger Vital Sign    Worried About Running Out of Food in the Last  Year: Never true    Ran Out of Food in the Last Year: Never true  Transportation Needs: No Transportation Needs (09/05/2022)   PRAPARE - Hydrologist (Medical): No    Lack of Transportation (Non-Medical): No  Physical Activity: Insufficiently Active (09/05/2022)   Exercise Vital Sign    Days of Exercise per Week: 1 day    Minutes of Exercise per Session: 40 min  Stress: Stress Concern Present (09/05/2022)   [place]    Feeling of Stress : Very much  Social Connections: Socially Isolated (09/05/2022)   Social Connection and Isolation Panel [NHANES]    Frequency of Communication with Friends and Family: More than three times a week    Frequency of Social Gatherings with Friends and Family: Once a week    Attends Religious Services: Never    Marine scientist or Organizations: No    Attends Music therapist: Not on file    Marital Status: Never married  Intimate Partner Violence: Not on file   Current Outpatient Medications:    cetirizine (ZYRTEC) 10 MG tablet, Take 10 mg by mouth as needed., Disp: , Rfl:    Multiple Vitamins-Minerals (MULTIVITAMIN ADULT, MINERALS,) TABS, Take 1 tablet by mouth daily., Disp: , Rfl:    pravastatin (PRAVACHOL) 20 MG tablet, Take 1 tablet (20 mg total) by mouth daily., Disp: 30 tablet, Rfl: 3   sertraline (ZOLOFT) 50 MG tablet, Take 1 tablet (50 mg total) by mouth daily., Disp: 30 tablet, Rfl: 3  EXAM:  VITALS per patient if applicable:Ht 5' 7.5" ([id number] m)   BMI 40.24 kg/m   GENERAL: alert, oriented, appears well and in no acute distress  HEENT: atraumatic, conjunctiva clear, no obvious abnormalities on inspection.  NECK: normal movements of the head and neck  LUNGS: on inspection no signs of respiratory distress, breathing rate appears normal, no obvious gross SOB, gasping or wheezing  CV: no obvious cyanosis  MS: moves all visible  extremities without noticeable abnormality  PSYCH/NEURO: pleasant and cooperative, no obvious depression, + anxiety. Speech and thought processing grossly intact  ASSESSMENT AND PLAN:  Discussed the following assessment and plan:  Anxiety disorder, unspecified type - Plan: Ambulatory referral to Psychiatry, sertraline (ZOLOFT) 50 MG tablet  Depression, major, recurrent, moderate (HCC) - Plan: sertraline (ZOLOFT) 50 MG tablet  Elevated blood pressure reading  Anxiety disorder, unspecified type Assessment & Plan: Problem has been worse for the past 2 months. She would like to resume Sertraline 50 mg, which helped in the past. She feels like she needs to establish with psychiatrist, referral placed. Letter  for work to excused her for days she already missed and to go back in a week. CBT also recommended.  Orders: -     Ambulatory referral to Psychiatry -     Sertraline HCl; Take 1 tablet (50 mg total) by mouth daily.  Dispense: 30 tablet; Refill: 3  Depression, major, recurrent, moderate (HCC) Assessment & Plan: Sertraline 50 mg daily started today. Referral to psychiatrist placed.  Orders: -     Sertraline HCl; Take 1 tablet (50 mg total) by mouth daily.  Dispense: 30 tablet; Refill: 3  Elevated blood pressure reading No known hx of HTN. Recommend monitoring BP at home. If persistently > 130/80, we need to discuss pharmacologic options.  We discussed possible serious and likely etiologies, options for evaluation and workup, limitations of telemedicine visit vs in person visit, treatment, treatment risks and precautions. The patient was advised to call back or seek an in-person evaluation if the symptoms worsen or if the condition fails to improve as anticipated. I discussed the assessment and treatment plan with the patient. The patient was provided an opportunity to ask questions and all were answered. The patient agreed with the plan and demonstrated an understanding of the  instructions.  Return in about 6 weeks (around 10/18/2022) for with psychistrist or ehre in teh office.. Gwenevere Goga G. Martinique, MD  [facility]. [place] office.

## 2022-09-07 NOTE — Assessment & Plan Note (Signed)
Problem has been worse for the past 2 months. She would like to resume Sertraline 50 mg, which helped in the past. She feels like she needs to establish with psychiatrist, referral placed. Letter for work to excused her for days she already missed and to go back in a week. CBT also recommended.

## 2022-09-07 NOTE — Assessment & Plan Note (Signed)
Sertraline 50 mg daily started today. Referral to psychiatrist placed.

## 2022-09-12 ENCOUNTER — Ambulatory Visit (INDEPENDENT_AMBULATORY_CARE_PROVIDER_SITE_OTHER): Payer: 59 | Admitting: Psychology

## 2022-09-12 DIAGNOSIS — F4321 Adjustment disorder with depressed mood: Secondary | ICD-10-CM
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
## 2022-09-26 ENCOUNTER — Ambulatory Visit: Payer: 59 | Admitting: Psychology
